# Patient Record
Sex: Male | Born: 1960
Health system: Southern US, Community
[De-identification: ages and names within clinical notes are randomized; demographics above are authoritative.]

## PROBLEM LIST (undated history)

## (undated) DIAGNOSIS — M1990 Unspecified osteoarthritis, unspecified site: Secondary | ICD-10-CM

## (undated) DIAGNOSIS — E785 Hyperlipidemia, unspecified: Secondary | ICD-10-CM

## (undated) DIAGNOSIS — K635 Polyp of colon: Secondary | ICD-10-CM

## (undated) DIAGNOSIS — T7840XA Allergy, unspecified, initial encounter: Secondary | ICD-10-CM

## (undated) HISTORY — DX: Polyp of colon: K63.5

## (undated) HISTORY — DX: Allergy, unspecified, initial encounter: T78.40XA

## (undated) HISTORY — DX: Hyperlipidemia, unspecified: E78.5

## (undated) HISTORY — PX: ROTATOR CUFF REPAIR: SHX139

## (undated) HISTORY — PX: CYSTECTOMY: SUR359

## (undated) HISTORY — PX: WISDOM TOOTH EXTRACTION: SHX21

## (undated) HISTORY — DX: Unspecified osteoarthritis, unspecified site: M19.90

## (undated) HISTORY — PX: COLONOSCOPY: SHX174

---

## 1981-05-21 HISTORY — PX: HAND SURGERY: SHX662

## 1999-03-28 ENCOUNTER — Encounter: Payer: Self-pay | Admitting: Emergency Medicine

## 1999-03-28 ENCOUNTER — Emergency Department (HOSPITAL_COMMUNITY): Admission: EM | Admit: 1999-03-28 | Discharge: 1999-03-28 | Payer: Self-pay | Admitting: Emergency Medicine

## 1999-05-22 DIAGNOSIS — K635 Polyp of colon: Secondary | ICD-10-CM

## 1999-05-22 HISTORY — DX: Polyp of colon: K63.5

## 1999-05-22 HISTORY — PX: COLONOSCOPY W/ POLYPECTOMY: SHX1380

## 1999-08-02 ENCOUNTER — Other Ambulatory Visit: Admission: RE | Admit: 1999-08-02 | Discharge: 1999-08-02 | Payer: Self-pay | Admitting: Gastroenterology

## 2002-11-22 ENCOUNTER — Encounter: Payer: Self-pay | Admitting: Emergency Medicine

## 2002-11-22 ENCOUNTER — Emergency Department (HOSPITAL_COMMUNITY): Admission: EM | Admit: 2002-11-22 | Discharge: 2002-11-23 | Payer: Self-pay | Admitting: *Deleted

## 2004-02-22 ENCOUNTER — Encounter: Payer: Self-pay | Admitting: Internal Medicine

## 2004-07-19 ENCOUNTER — Ambulatory Visit: Payer: Self-pay | Admitting: Gastroenterology

## 2004-08-04 ENCOUNTER — Ambulatory Visit: Payer: Self-pay | Admitting: Gastroenterology

## 2005-03-22 ENCOUNTER — Emergency Department (HOSPITAL_COMMUNITY): Admission: EM | Admit: 2005-03-22 | Discharge: 2005-03-22 | Payer: Self-pay | Admitting: *Deleted

## 2005-03-22 ENCOUNTER — Ambulatory Visit: Payer: Self-pay | Admitting: Internal Medicine

## 2005-03-24 ENCOUNTER — Emergency Department (HOSPITAL_COMMUNITY): Admission: EM | Admit: 2005-03-24 | Discharge: 2005-03-24 | Payer: Self-pay | Admitting: Family Medicine

## 2005-05-08 ENCOUNTER — Ambulatory Visit: Payer: Self-pay | Admitting: Internal Medicine

## 2005-05-15 ENCOUNTER — Ambulatory Visit: Payer: Self-pay | Admitting: Internal Medicine

## 2005-07-04 ENCOUNTER — Ambulatory Visit: Payer: Self-pay | Admitting: Internal Medicine

## 2005-07-19 ENCOUNTER — Ambulatory Visit: Payer: Self-pay | Admitting: Internal Medicine

## 2006-06-27 ENCOUNTER — Ambulatory Visit: Payer: Self-pay | Admitting: Internal Medicine

## 2006-06-27 LAB — CONVERTED CEMR LAB
ALT: 22 units/L (ref 0–40)
AST: 22 units/L (ref 0–37)
Albumin: 3.7 g/dL (ref 3.5–5.2)
Alkaline Phosphatase: 42 units/L (ref 39–117)
BUN: 9 mg/dL (ref 6–23)
Basophils Absolute: 0 10*3/uL (ref 0.0–0.1)
Basophils Relative: 0.6 % (ref 0.0–1.0)
Bilirubin, Direct: 0.1 mg/dL (ref 0.0–0.3)
CO2: 29 meq/L (ref 19–32)
Calcium: 9.4 mg/dL (ref 8.4–10.5)
Chloride: 108 meq/L (ref 96–112)
Cholesterol: 225 mg/dL (ref 0–200)
Creatinine, Ser: 1 mg/dL (ref 0.4–1.5)
Direct LDL: 170.5 mg/dL
Eosinophils Absolute: 0.1 10*3/uL (ref 0.0–0.6)
Eosinophils Relative: 2.7 % (ref 0.0–5.0)
GFR calc Af Amer: 103 mL/min
GFR calc non Af Amer: 85 mL/min
Glucose, Bld: 96 mg/dL (ref 70–99)
HCT: 47.3 % (ref 39.0–52.0)
HDL: 42.6 mg/dL (ref >39.0)
Hemoglobin: 15.9 g/dL (ref 13.0–17.0)
Lymphocytes Relative: 53.4 % — ABNORMAL HIGH (ref 12.0–46.0)
MCHC: 33.7 g/dL (ref 30.0–36.0)
MCV: 89.7 fL (ref 78.0–100.0)
Monocytes Absolute: 0.3 10*3/uL (ref 0.2–0.7)
Monocytes Relative: 9.2 % (ref 3.0–11.0)
Neutro Abs: 1.1 10*3/uL — ABNORMAL LOW (ref 1.4–7.7)
Neutrophils Relative %: 34.1 % — ABNORMAL LOW (ref 43.0–77.0)
PSA: 1.15 ng/mL (ref 0.10–4.00)
Platelets: 174 10*3/uL (ref 150–400)
Potassium: 4.3 meq/L (ref 3.5–5.1)
RBC: 5.27 M/uL (ref 4.22–5.81)
RDW: 13 % (ref 11.5–14.6)
Sodium: 141 meq/L (ref 135–145)
TSH: 1.22 microintl units/mL (ref 0.35–5.50)
Total Bilirubin: 0.6 mg/dL (ref 0.3–1.2)
Total CHOL/HDL Ratio: 5.3
Total Protein: 7.3 g/dL (ref 6.0–8.3)
Triglycerides: 89 mg/dL (ref 0–149)
VLDL: 18 mg/dL (ref 0–40)
WBC: 3.2 10*3/uL — ABNORMAL LOW (ref 4.5–10.5)

## 2006-09-04 DIAGNOSIS — M6740 Ganglion, unspecified site: Secondary | ICD-10-CM | POA: Insufficient documentation

## 2006-09-16 ENCOUNTER — Ambulatory Visit: Payer: Self-pay | Admitting: Internal Medicine

## 2006-09-16 LAB — CONVERTED CEMR LAB
ALT: 27 units/L (ref 0–40)
AST: 26 units/L (ref 0–37)
Cholesterol: 167 mg/dL (ref 0–200)
HDL: 53.3 mg/dL (ref >39.0)
LDL Cholesterol: 99 mg/dL (ref 0–99)
Total CHOL/HDL Ratio: 3.1
Triglycerides: 76 mg/dL (ref 0–149)
VLDL: 15 mg/dL (ref 0–40)

## 2007-11-18 ENCOUNTER — Ambulatory Visit: Payer: Self-pay | Admitting: Internal Medicine

## 2007-11-18 DIAGNOSIS — E785 Hyperlipidemia, unspecified: Secondary | ICD-10-CM

## 2007-11-18 DIAGNOSIS — E782 Mixed hyperlipidemia: Secondary | ICD-10-CM | POA: Insufficient documentation

## 2007-11-18 DIAGNOSIS — Z720 Tobacco use: Secondary | ICD-10-CM | POA: Insufficient documentation

## 2007-11-18 LAB — CONVERTED CEMR LAB
Cholesterol, target level: 200 mg/dL
HDL goal, serum: 40 mg/dL
LDL Goal: 130 mg/dL

## 2007-11-26 ENCOUNTER — Ambulatory Visit: Payer: Self-pay | Admitting: Internal Medicine

## 2007-11-27 ENCOUNTER — Encounter (INDEPENDENT_AMBULATORY_CARE_PROVIDER_SITE_OTHER): Payer: Self-pay | Admitting: *Deleted

## 2007-11-27 ENCOUNTER — Telehealth (INDEPENDENT_AMBULATORY_CARE_PROVIDER_SITE_OTHER): Payer: Self-pay | Admitting: *Deleted

## 2007-11-27 LAB — CONVERTED CEMR LAB
ALT: 21 units/L (ref 0–53)
AST: 20 units/L (ref 0–37)
Albumin: 3.8 g/dL (ref 3.5–5.2)
Alkaline Phosphatase: 41 units/L (ref 39–117)
BUN: 10 mg/dL (ref 6–23)
Basophils Absolute: 0.1 10*3/uL (ref 0.0–0.1)
Basophils Relative: 1.6 % — ABNORMAL HIGH (ref 0.0–1.0)
Bilirubin, Direct: 0.1 mg/dL (ref 0.0–0.3)
CO2: 28 meq/L (ref 19–32)
Calcium: 9.2 mg/dL (ref 8.4–10.5)
Chloride: 106 meq/L (ref 96–112)
Cholesterol: 235 mg/dL (ref 0–200)
Creatinine, Ser: 1 mg/dL (ref 0.4–1.5)
Direct LDL: 164.5 mg/dL
Eosinophils Absolute: 0.1 10*3/uL (ref 0.0–0.7)
Eosinophils Relative: 3.3 % (ref 0.0–5.0)
GFR calc Af Amer: 102 mL/min
GFR calc non Af Amer: 84 mL/min
Glucose, Bld: 90 mg/dL (ref 70–99)
HCT: 46.8 % (ref 39.0–52.0)
HDL: 53.6 mg/dL (ref >39.0)
Hemoglobin: 15.8 g/dL (ref 13.0–17.0)
Lymphocytes Relative: 42.7 % (ref 12.0–46.0)
MCHC: 33.7 g/dL (ref 30.0–36.0)
MCV: 92.1 fL (ref 78.0–100.0)
Monocytes Absolute: 0.4 10*3/uL (ref 0.1–1.0)
Monocytes Relative: 11.5 % (ref 3.0–12.0)
Neutro Abs: 1.6 10*3/uL (ref 1.4–7.7)
Neutrophils Relative %: 40.9 % — ABNORMAL LOW (ref 43.0–77.0)
PSA: 1.02 ng/mL (ref 0.10–4.00)
Platelets: 166 10*3/uL (ref 150–400)
Potassium: 4 meq/L (ref 3.5–5.1)
RBC: 5.09 M/uL (ref 4.22–5.81)
RDW: 13 % (ref 11.5–14.6)
Sodium: 140 meq/L (ref 135–145)
TSH: 1.6 microintl units/mL (ref 0.35–5.50)
Total Bilirubin: 0.8 mg/dL (ref 0.3–1.2)
Total CHOL/HDL Ratio: 4.4
Total Protein: 7.1 g/dL (ref 6.0–8.3)
Triglycerides: 73 mg/dL (ref 0–149)
Uric Acid, Serum: 4.5 mg/dL (ref 4.0–7.8)
VLDL: 15 mg/dL (ref 0–40)
WBC: 3.8 10*3/uL — ABNORMAL LOW (ref 4.5–10.5)

## 2007-12-12 ENCOUNTER — Telehealth (INDEPENDENT_AMBULATORY_CARE_PROVIDER_SITE_OTHER): Payer: Self-pay | Admitting: *Deleted

## 2007-12-17 ENCOUNTER — Encounter: Admission: RE | Admit: 2007-12-17 | Discharge: 2008-01-22 | Payer: Self-pay | Admitting: Internal Medicine

## 2007-12-21 ENCOUNTER — Encounter: Payer: Self-pay | Admitting: Internal Medicine

## 2008-01-22 ENCOUNTER — Encounter: Payer: Self-pay | Admitting: Internal Medicine

## 2008-02-03 ENCOUNTER — Encounter: Payer: Self-pay | Admitting: Internal Medicine

## 2008-02-23 ENCOUNTER — Ambulatory Visit: Payer: Self-pay | Admitting: Internal Medicine

## 2008-02-29 LAB — CONVERTED CEMR LAB
ALT: 22 units/L (ref 0–53)
AST: 20 units/L (ref 0–37)
Albumin: 3.8 g/dL (ref 3.5–5.2)
Alkaline Phosphatase: 38 units/L — ABNORMAL LOW (ref 39–117)
Bilirubin, Direct: 0.1 mg/dL (ref 0.0–0.3)
Cholesterol: 212 mg/dL (ref 0–200)
Direct LDL: 133.8 mg/dL
HDL: 52.7 mg/dL (ref >39.0)
Total Bilirubin: 0.7 mg/dL (ref 0.3–1.2)
Total CHOL/HDL Ratio: 4
Total Protein: 7.1 g/dL (ref 6.0–8.3)
Triglycerides: 48 mg/dL (ref 0–149)
VLDL: 10 mg/dL (ref 0–40)

## 2008-03-04 ENCOUNTER — Telehealth (INDEPENDENT_AMBULATORY_CARE_PROVIDER_SITE_OTHER): Payer: Self-pay | Admitting: *Deleted

## 2008-03-04 ENCOUNTER — Encounter (INDEPENDENT_AMBULATORY_CARE_PROVIDER_SITE_OTHER): Payer: Self-pay | Admitting: *Deleted

## 2008-06-01 ENCOUNTER — Ambulatory Visit: Payer: Self-pay | Admitting: Internal Medicine

## 2008-06-01 ENCOUNTER — Telehealth (INDEPENDENT_AMBULATORY_CARE_PROVIDER_SITE_OTHER): Payer: Self-pay | Admitting: *Deleted

## 2008-06-09 ENCOUNTER — Telehealth (INDEPENDENT_AMBULATORY_CARE_PROVIDER_SITE_OTHER): Payer: Self-pay | Admitting: *Deleted

## 2008-06-15 ENCOUNTER — Encounter (INDEPENDENT_AMBULATORY_CARE_PROVIDER_SITE_OTHER): Payer: Self-pay | Admitting: *Deleted

## 2008-06-15 LAB — CONVERTED CEMR LAB
ALT: 26 units/L (ref 0–53)
AST: 19 units/L (ref 0–37)
Cholesterol: 194 mg/dL (ref 0–200)
HDL: 52.4 mg/dL (ref >39.0)
LDL Cholesterol: 126 mg/dL — ABNORMAL HIGH (ref 0–99)
Total CHOL/HDL Ratio: 3.7
Triglycerides: 79 mg/dL (ref 0–149)
VLDL: 16 mg/dL (ref 0–40)

## 2008-07-13 ENCOUNTER — Ambulatory Visit: Payer: Self-pay | Admitting: Internal Medicine

## 2008-07-13 DIAGNOSIS — F411 Generalized anxiety disorder: Secondary | ICD-10-CM | POA: Insufficient documentation

## 2008-07-13 DIAGNOSIS — F329 Major depressive disorder, single episode, unspecified: Secondary | ICD-10-CM | POA: Insufficient documentation

## 2008-08-12 ENCOUNTER — Ambulatory Visit: Payer: Self-pay | Admitting: Internal Medicine

## 2008-10-12 ENCOUNTER — Ambulatory Visit: Payer: Self-pay | Admitting: Internal Medicine

## 2009-01-21 ENCOUNTER — Ambulatory Visit: Payer: Self-pay | Admitting: Internal Medicine

## 2009-01-21 DIAGNOSIS — M79609 Pain in unspecified limb: Secondary | ICD-10-CM | POA: Insufficient documentation

## 2009-01-30 LAB — CONVERTED CEMR LAB
ALT: 20 units/L (ref 0–53)
AST: 18 units/L (ref 0–37)
Albumin: 4.1 g/dL (ref 3.5–5.2)
Alkaline Phosphatase: 42 units/L (ref 39–117)
BUN: 11 mg/dL (ref 6–23)
Basophils Absolute: 0 10*3/uL (ref 0.0–0.1)
Basophils Relative: 0.6 % (ref 0.0–3.0)
Bilirubin, Direct: 0 mg/dL (ref 0.0–0.3)
CO2: 31 meq/L (ref 19–32)
Calcium: 9.3 mg/dL (ref 8.4–10.5)
Chloride: 107 meq/L (ref 96–112)
Cholesterol: 236 mg/dL — ABNORMAL HIGH (ref 0–200)
Creatinine, Ser: 1 mg/dL (ref 0.4–1.5)
Direct LDL: 164.7 mg/dL
Eosinophils Absolute: 0.2 10*3/uL (ref 0.0–0.7)
Eosinophils Relative: 3.8 % (ref 0.0–5.0)
GFR calc non Af Amer: 101.59 mL/min (ref >60)
Glucose, Bld: 73 mg/dL (ref 70–99)
HCT: 44.8 % (ref 39.0–52.0)
HDL: 52.8 mg/dL (ref >39.00)
Hemoglobin: 15 g/dL (ref 13.0–17.0)
Lymphocytes Relative: 43.6 % (ref 12.0–46.0)
Lymphs Abs: 1.9 10*3/uL (ref 0.7–4.0)
MCHC: 33.4 g/dL (ref 30.0–36.0)
MCV: 92.1 fL (ref 78.0–100.0)
Monocytes Absolute: 0.5 10*3/uL (ref 0.1–1.0)
Monocytes Relative: 11.9 % (ref 3.0–12.0)
Neutro Abs: 1.7 10*3/uL (ref 1.4–7.7)
Neutrophils Relative %: 40.1 % — ABNORMAL LOW (ref 43.0–77.0)
PSA: 2.66 ng/mL (ref 0.10–4.00)
Platelets: 158 10*3/uL (ref 150.0–400.0)
Potassium: 4 meq/L (ref 3.5–5.1)
RBC: 4.87 M/uL (ref 4.22–5.81)
RDW: 14.2 % (ref 11.5–14.6)
Sodium: 143 meq/L (ref 135–145)
TSH: 1.11 microintl units/mL (ref 0.35–5.50)
Total Bilirubin: 0.9 mg/dL (ref 0.3–1.2)
Total CHOL/HDL Ratio: 4
Total Protein: 7.2 g/dL (ref 6.0–8.3)
Triglycerides: 79 mg/dL (ref 0.0–149.0)
Uric Acid, Serum: 4.3 mg/dL (ref 4.0–7.8)
VLDL: 15.8 mg/dL (ref 0.0–40.0)
WBC: 4.3 10*3/uL — ABNORMAL LOW (ref 4.5–10.5)

## 2009-02-02 ENCOUNTER — Telehealth (INDEPENDENT_AMBULATORY_CARE_PROVIDER_SITE_OTHER): Payer: Self-pay | Admitting: *Deleted

## 2009-02-02 ENCOUNTER — Encounter (INDEPENDENT_AMBULATORY_CARE_PROVIDER_SITE_OTHER): Payer: Self-pay | Admitting: *Deleted

## 2009-02-09 ENCOUNTER — Telehealth: Payer: Self-pay | Admitting: Internal Medicine

## 2009-04-26 ENCOUNTER — Ambulatory Visit: Payer: Self-pay | Admitting: Internal Medicine

## 2009-05-01 LAB — CONVERTED CEMR LAB
ALT: 27 units/L (ref 0–53)
AST: 23 units/L (ref 0–37)
Albumin: 3.8 g/dL (ref 3.5–5.2)
Alkaline Phosphatase: 32 units/L — ABNORMAL LOW (ref 39–117)
Bilirubin, Direct: 0 mg/dL (ref 0.0–0.3)
Cholesterol: 207 mg/dL — ABNORMAL HIGH (ref 0–200)
Direct LDL: 143 mg/dL
HDL: 55.3 mg/dL (ref >39.00)
Total Bilirubin: 0.7 mg/dL (ref 0.3–1.2)
Total CHOL/HDL Ratio: 4
Total Protein: 6.9 g/dL (ref 6.0–8.3)
Triglycerides: 52 mg/dL (ref 0.0–149.0)
VLDL: 10.4 mg/dL (ref 0.0–40.0)

## 2009-05-02 ENCOUNTER — Encounter (INDEPENDENT_AMBULATORY_CARE_PROVIDER_SITE_OTHER): Payer: Self-pay | Admitting: *Deleted

## 2009-06-30 ENCOUNTER — Encounter (INDEPENDENT_AMBULATORY_CARE_PROVIDER_SITE_OTHER): Payer: Self-pay | Admitting: *Deleted

## 2009-07-05 ENCOUNTER — Ambulatory Visit: Payer: Self-pay | Admitting: Internal Medicine

## 2009-07-11 LAB — CONVERTED CEMR LAB
ALT: 24 units/L (ref 0–53)
AST: 21 units/L (ref 0–37)
Albumin: 3.8 g/dL (ref 3.5–5.2)
Alkaline Phosphatase: 35 units/L — ABNORMAL LOW (ref 39–117)
Bilirubin, Direct: 0.1 mg/dL (ref 0.0–0.3)
Cholesterol: 206 mg/dL — ABNORMAL HIGH (ref 0–200)
Direct LDL: 132.3 mg/dL
HDL: 61.9 mg/dL (ref >39.00)
Total Bilirubin: 0.5 mg/dL (ref 0.3–1.2)
Total CHOL/HDL Ratio: 3
Total Protein: 7 g/dL (ref 6.0–8.3)
Triglycerides: 69 mg/dL (ref 0.0–149.0)
VLDL: 13.8 mg/dL (ref 0.0–40.0)

## 2009-08-11 ENCOUNTER — Encounter (INDEPENDENT_AMBULATORY_CARE_PROVIDER_SITE_OTHER): Payer: Self-pay | Admitting: *Deleted

## 2009-08-12 ENCOUNTER — Ambulatory Visit: Payer: Self-pay | Admitting: Gastroenterology

## 2009-08-25 ENCOUNTER — Ambulatory Visit: Payer: Self-pay | Admitting: Gastroenterology

## 2010-01-08 ENCOUNTER — Emergency Department (HOSPITAL_COMMUNITY): Admission: EM | Admit: 2010-01-08 | Discharge: 2010-01-09 | Payer: Self-pay | Admitting: Emergency Medicine

## 2010-05-16 ENCOUNTER — Ambulatory Visit: Payer: Self-pay | Admitting: Internal Medicine

## 2010-06-20 NOTE — Letter (Signed)
Summary: Colonoscopy Letter  [facility]  [address] [place], [place] [address]   Phone: [phone]  Fax: [phone]      June 30, 2009 MRN: [id number]   CARDER YIN [address] [place], [place]  [address]   Dear Mr. PERETTI,   According to your medical record, it is time for you to schedule a Colonoscopy. The American Cancer Society recommends this procedure as a method to detect early colon cancer. Patients with a family history of colon cancer, or a personal history of colon polyps or inflammatory bowel disease are at increased risk.  This letter has beeen generated based on the recommendations made at the time of your procedure. If you feel that in your particular situation this may no longer apply, please contact our office.  Please call our office at [phone] to schedule this appointment or to update your records at your earliest convenience.  Thank you for cooperating with [place] to provide you with the very best care possible.   Sincerely,  Judie Petit T. Russella Dar, M.D.  [facility] [phone]

## 2010-06-20 NOTE — Procedures (Signed)
Summary: Colonoscopy  Patient: Clifford Newman Note: All result statuses are Final unless otherwise noted.  Tests: (1) Colonoscopy (COL)   COL Colonoscopy           DONE     [facility]     [address]     [place], [place]  [address]           COLONOSCOPY PROCEDURE REPORT           PATIENT:  Clifford Newman, Clifford Newman  MR#:  [id number]     BIRTHDATE:  08/05/1960, 50 yrs. old  GENDER:  male           ENDOSCOPIST:  Judie Petit T. Russella Dar, MD, [facility]           PROCEDURE DATE:  08/25/2009     PROCEDURE:  Colonoscopy [address]2956     ASA CLASS:  Class II     INDICATIONS:  1) follow-up of polyp  2) family history of colon     cancer  3) screening, adenomatous polyp, 07/1999. Father at age 52.                 MEDICATIONS:   Fentanyl 100 mcg IV, Versed 7 mg IV           DESCRIPTION OF PROCEDURE:   After the risks benefits and     alternatives of the procedure were thoroughly explained, informed     consent was obtained.  Digital rectal exam was performed and     revealed no abnormalities.   The LB [id number]-[id number] endoscope     was introduced through the anus and advanced to the cecum, which     was identified by both the appendix and ileocecal valve, without     limitations.  The quality of the prep was excellent, using     MoviPrep.  The instrument was then slowly withdrawn as the colon     was fully examined.     <<PROCEDUREIMAGES>>           FINDINGS:  A normal appearing cecum, ileocecal valve, and     appendiceal orifice were identified. The ascending, hepatic     flexure, transverse, splenic flexure, descending, and rectum     appeared unremarkable. Mild diverticulosis was found in the     sigmoid colon. Retroflexed views in the rectum revealed no     abnormalities. The time to cecum =  2  minutes. The scope was then     withdrawn (time =  8.5  min) from the patient and the procedure     completed.           COMPLICATIONS:  None           ENDOSCOPIC IMPRESSION:           1) Mild  diverticulosis           RECOMMENDATIONS:     1) High fiber diet with liberal fluid intake.     2) Repeat Colonoscopy in 5 years.           Venita Lick. Russella Dar, MD, [facility]           CC: Pecola Lawless, MD           n.     Rosalie DoctorVenita Lick. Zamzam Whinery at 08/25/2009 09:00 AM           Alyce Pagan, [id number]  Note: An exclamation mark (!) indicates a result that was not dispersed into the  flowsheet. Document Creation Date: 08/25/2009 9:00 AM _______________________________________________________________________  (1) Order result status: Final Collection or observation date-time: 08/25/2009 08:57 Requested date-time:  Receipt date-time:  Reported date-time:  Referring Physician:   Ordering Physician: Claudette Head [phone]) Specimen Source:  Source: Launa Grill Order Number: [id number] Lab site:   Appended Document: Colonoscopy    Clinical Lists Changes  Observations: Added new observation of COLONNXTDUE: 08/2014 (08/25/2009 12:40)

## 2010-06-20 NOTE — Letter (Signed)
Summary: [facility] Instructions  [facility]  [address] [place], [place] [address]   Phone: [phone]  Fax: [phone]       JANTHONY HOLLEMAN    1960/10/12    MRN: [id number]        Procedure Day /Date:  08/25/09  Thursday       Arrival Time:  7:30am      Procedure Time:  8:30am     Location of Procedure:                    _ x_  [facility] (4th Floor)  PREPARATION FOR COLONOSCOPY WITH MOVIPREP   Starting 5 days prior to your procedure 08/20/09  do not eat nuts, seeds, popcorn, corn, beans, peas,  salads, or any raw vegetables.  Do not take any fiber supplements (e.g. Metamucil, Citrucel, and Benefiber).  THE DAY BEFORE YOUR PROCEDURE         DATE:   08/24/09  DAY:  Wednesday  1.  Drink clear liquids the entire day-NO SOLID FOOD  2.  Do not drink anything colored red or purple.  Avoid juices with pulp.  No orange juice.  3.  Drink at least 64 oz. (8 glasses) of fluid/clear liquids during the day to prevent dehydration and help the prep work efficiently.  CLEAR LIQUIDS INCLUDE: Water Jello Ice Popsicles Tea (sugar ok, no milk/cream) Powdered fruit flavored drinks Coffee (sugar ok, no milk/cream) Gatorade Juice: apple, white grape, white cranberry  Lemonade Clear bullion, consomm, broth Carbonated beverages (any kind) Strained chicken noodle soup Hard Candy                             4.  In the morning, mix first dose of MoviPrep solution:    Empty 1 Pouch A and 1 Pouch B into the disposable container    Add lukewarm drinking water to the top line of the container. Mix to dissolve    Refrigerate (mixed solution should be used within 24 hrs)  5.  Begin drinking the prep at 5:00 p.m. The MoviPrep container is divided by 4 marks.   Every 15 minutes drink the solution down to the next mark (approximately 8 oz) until the full liter is complete.   6.  Follow completed prep with 16 oz of clear liquid of your choice (Nothing red or purple).   Continue to drink clear liquids until bedtime.  7.  Before going to bed, mix second dose of MoviPrep solution:    Empty 1 Pouch A and 1 Pouch B into the disposable container    Add lukewarm drinking water to the top line of the container. Mix to dissolve    Refrigerate  THE DAY OF YOUR PROCEDURE      DATE:   08/25/09  DAY:  Thursday  Beginning at   3:30 a.m. (5 hours before procedure):         1. Every 15 minutes, drink the solution down to the next mark (approx 8 oz) until the full liter is complete.  2. Follow completed prep with 16 oz. of clear liquid of your choice.    3. You may drink clear liquids until 6:30am   (2 HOURS BEFORE PROCEDURE).   MEDICATION INSTRUCTIONS  Unless otherwise instructed, you should take regular prescription medications with a small sip of water   as early as possible the morning of your procedure.  OTHER INSTRUCTIONS  You will need a responsible adult at least 52 years of age to accompany you and drive you home.   This person must remain in the waiting room during your procedure.  Wear loose fitting clothing that is easily removed.  Leave jewelry and other valuables at home.  However, you may wish to bring a book to read or  an iPod/MP3 player to listen to music as you wait for your procedure to start.  Remove all body piercing jewelry and leave at home.  Total time from sign-in until discharge is approximately 2-3 hours.  You should go home directly after your procedure and rest.  You can resume normal activities the  day after your procedure.  The day of your procedure you should not:   Drive   Make legal decisions   Operate machinery   Drink alcohol   Return to work  You will receive specific instructions about eating, activities and medications before you leave.    The above instructions have been reviewed and explained to me by  Ezra Sites RN  August 12, 2009 8:54 AM    I fully understand and can verbalize  these instructions _____________________________ Date _________

## 2010-06-20 NOTE — Miscellaneous (Signed)
Summary: [facility]  Clinical Lists Changes  Medications: Added new medication of MOVIPREP 100 GM  SOLR (PEG-KCL-NACL-NASULF-NA ASC-C) As per prep instructions. - Signed Rx of MOVIPREP 100 GM  SOLR (PEG-KCL-NACL-NASULF-NA ASC-C) As per prep instructions.;  #1 x 0;  Signed;  Entered by: Ezra Sites RN;  Authorized by: Meryl Dare MD [facility];  Method used: Electronically to [facility]  [address] [id number] [address], [place], [place]  [address], Ph: [phone] or [phone], Fax: [phone] Observations: Added new observation of NKA: T (08/12/2009 8:37)    Prescriptions: MOVIPREP 100 GM  SOLR (PEG-KCL-NACL-NASULF-NA ASC-C) As per prep instructions.  #1 x 0   Entered by:   Ezra Sites RN   Authorized by:   Meryl Dare MD [facility]   Signed by:   Ezra Sites RN on 08/12/2009   Method used:   [facility]ronically to        [facility]  [address] [id number] (retail)       [address]       [place], [place]  [address]       Ph: [phone] or [phone]       Fax: [phone]   RxID:   [id number]

## 2010-08-04 LAB — CBC
HCT: 41.5 % (ref 39.0–52.0)
Hemoglobin: 14.4 g/dL (ref 13.0–17.0)
MCH: 31.1 pg (ref 26.0–34.0)
MCHC: 34.6 g/dL (ref 30.0–36.0)
MCV: 89.8 fL (ref 78.0–100.0)
Platelets: 152 K/uL (ref 150–400)
RBC: 4.63 MIL/uL (ref 4.22–5.81)
RDW: 13.6 % (ref 11.5–15.5)
WBC: 7.4 K/uL (ref 4.0–10.5)

## 2010-08-04 LAB — HEPATIC FUNCTION PANEL
ALT: 16 U/L (ref 0–53)
AST: 17 U/L (ref 0–37)
Albumin: 3.2 g/dL — ABNORMAL LOW (ref 3.5–5.2)
Alkaline Phosphatase: 40 U/L (ref 39–117)
Bilirubin, Direct: 0.2 mg/dL (ref 0.0–0.3)
Indirect Bilirubin: 0.8 mg/dL (ref 0.3–0.9)
Total Bilirubin: 1 mg/dL (ref 0.3–1.2)
Total Protein: 6.8 g/dL (ref 6.0–8.3)

## 2010-08-04 LAB — DIFFERENTIAL
Basophils Absolute: 0 10*3/uL (ref 0.0–0.1)
Basophils Relative: 0 % (ref 0–1)
Eosinophils Absolute: 0 10*3/uL (ref 0.0–0.7)
Eosinophils Relative: 1 % (ref 0–5)
Lymphocytes Relative: 14 % (ref 12–46)
Lymphs Abs: 1 10*3/uL (ref 0.7–4.0)
Monocytes Absolute: 0.8 10*3/uL (ref 0.1–1.0)
Monocytes Relative: 10 % (ref 3–12)
Neutro Abs: 5.6 10*3/uL (ref 1.7–7.7)
Neutrophils Relative %: 75 % (ref 43–77)

## 2010-08-04 LAB — URINE MICROSCOPIC-ADD ON

## 2010-08-04 LAB — URINALYSIS, ROUTINE W REFLEX MICROSCOPIC
Bilirubin Urine: NEGATIVE
Glucose, UA: NEGATIVE mg/dL
Hgb urine dipstick: NEGATIVE
Nitrite: NEGATIVE
Protein, ur: NEGATIVE mg/dL
Specific Gravity, Urine: 1.018 (ref 1.005–1.030)
Urobilinogen, UA: 0.2 mg/dL (ref 0.0–1.0)
pH: 7.5 (ref 5.0–8.0)

## 2010-08-04 LAB — POCT I-STAT, CHEM 8
BUN: 10 mg/dL (ref 6–23)
Calcium, Ion: 1.08 mmol/L — ABNORMAL LOW (ref 1.12–1.32)
Chloride: 107 mEq/L (ref 96–112)
Creatinine, Ser: 1 mg/dL (ref 0.4–1.5)
Glucose, Bld: 130 mg/dL — ABNORMAL HIGH (ref 70–99)
HCT: 46 % (ref 39.0–52.0)
Hemoglobin: 15.6 g/dL (ref 13.0–17.0)
Potassium: 3.8 mEq/L (ref 3.5–5.1)
Sodium: 140 mEq/L (ref 135–145)
TCO2: 25 mmol/L (ref 0–100)

## 2010-08-04 LAB — URINE CULTURE
Colony Count: >=100000
Culture  Setup Time: 201108220023

## 2010-08-04 LAB — LIPASE, BLOOD: Lipase: 22 U/L (ref 11–59)

## 2010-12-26 ENCOUNTER — Encounter: Payer: Self-pay | Admitting: Internal Medicine

## 2010-12-26 ENCOUNTER — Ambulatory Visit (INDEPENDENT_AMBULATORY_CARE_PROVIDER_SITE_OTHER): Payer: 59 | Admitting: Internal Medicine

## 2010-12-26 VITALS — BP 132/88 | HR 80 | Temp 98.6°F | Resp 14 | Ht 74.25 in | Wt 270.6 lb

## 2010-12-26 DIAGNOSIS — E785 Hyperlipidemia, unspecified: Secondary | ICD-10-CM

## 2010-12-26 DIAGNOSIS — F17200 Nicotine dependence, unspecified, uncomplicated: Secondary | ICD-10-CM

## 2010-12-26 DIAGNOSIS — Z0000 Encounter for general adult medical examination without abnormal findings: Secondary | ICD-10-CM

## 2010-12-26 LAB — CBC WITH DIFFERENTIAL/PLATELET
Basophils Absolute: 0 10*3/uL (ref 0.0–0.1)
Basophils Relative: 0.7 % (ref 0.0–3.0)
Eosinophils Absolute: 0.1 10*3/uL (ref 0.0–0.7)
Eosinophils Relative: 3.3 % (ref 0.0–5.0)
HCT: 46 % (ref 39.0–52.0)
Hemoglobin: 15.4 g/dL (ref 13.0–17.0)
Lymphocytes Relative: 39.6 % (ref 12.0–46.0)
Lymphs Abs: 1.5 10*3/uL (ref 0.7–4.0)
MCHC: 33.4 g/dL (ref 30.0–36.0)
MCV: 91 fl (ref 78.0–100.0)
Monocytes Absolute: 0.4 10*3/uL (ref 0.1–1.0)
Monocytes Relative: 10.6 % (ref 3.0–12.0)
Neutro Abs: 1.7 10*3/uL (ref 1.4–7.7)
Neutrophils Relative %: 45.8 % (ref 43.0–77.0)
Platelets: 163 10*3/uL (ref 150.0–400.0)
RBC: 5.06 Mil/uL (ref 4.22–5.81)
RDW: 14.8 % — ABNORMAL HIGH (ref 11.5–14.6)
WBC: 3.8 10*3/uL — ABNORMAL LOW (ref 4.5–10.5)

## 2010-12-26 LAB — BASIC METABOLIC PANEL
BUN: 12 mg/dL (ref 6–23)
CO2: 27 mEq/L (ref 19–32)
Calcium: 9 mg/dL (ref 8.4–10.5)
Chloride: 107 mEq/L (ref 96–112)
Creatinine, Ser: 1.1 mg/dL (ref 0.4–1.5)
GFR: 94.26 mL/min (ref >60.00)
Glucose, Bld: 91 mg/dL (ref 70–99)
Potassium: 4.3 mEq/L (ref 3.5–5.1)
Sodium: 142 mEq/L (ref 135–145)

## 2010-12-26 LAB — LIPID PANEL
Cholesterol: 226 mg/dL — ABNORMAL HIGH (ref 0–200)
HDL: 56.7 mg/dL (ref >39.00)
Total CHOL/HDL Ratio: 4
Triglycerides: 79 mg/dL (ref 0.0–149.0)
VLDL: 15.8 mg/dL (ref 0.0–40.0)

## 2010-12-26 LAB — HEPATIC FUNCTION PANEL
ALT: 23 U/L (ref 0–53)
AST: 19 U/L (ref 0–37)
Albumin: 3.8 g/dL (ref 3.5–5.2)
Alkaline Phosphatase: 40 U/L (ref 39–117)
Bilirubin, Direct: 0.1 mg/dL (ref 0.0–0.3)
Total Bilirubin: 0.5 mg/dL (ref 0.3–1.2)
Total Protein: 7 g/dL (ref 6.0–8.3)

## 2010-12-26 LAB — PSA: PSA: 1.17 ng/mL (ref 0.10–4.00)

## 2010-12-26 LAB — LDL CHOLESTEROL, DIRECT: Direct LDL: 143.4 mg/dL

## 2010-12-26 LAB — TSH: TSH: 1.44 u[IU]/mL (ref 0.35–5.50)

## 2010-12-26 MED ORDER — SILDENAFIL CITRATE 100 MG PO TABS: 100.0000 mg | ORAL_TABLET | Freq: Every day | ORAL | Status: DC | PRN

## 2010-12-26 MED ORDER — VARENICLINE TARTRATE 0.5 MG PO TABS: 0.5000 mg | ORAL_TABLET | Freq: Two times a day (BID) | ORAL | Status: AC

## 2010-12-26 NOTE — Patient Instructions (Signed)
Avoid stimulants :decongestants, diet pills, nicotine, caffeine( coffee, tea,cola, chocolate) to excess to prevent  palpitations Please think about quitting smoking. Review the risks we discussed. Please call 1-800-QUIT-NOW [phone]) for free smoking cessation counseling. Exercise at least 30-45 minutes a day,  3-4 days a week.  Eat a low-fat diet with lots of fruits and vegetables, up to 7-9 servings per day. Avoid obesity; your goal is waist measurement < 40 inches.Consume less than 40 grams of sugar per day from foods & drinks with High Fructose Corn Sugar as #1,2,3 or # 4 on label. Health Care Power of Attorney & Living Will. Complete if not in place ; these place you in charge of your health care decisions.

## 2010-12-26 NOTE — Progress Notes (Signed)
Subjective:    Patient ID: Clifford Newman, male    DOB: 12/14/1960, 50 y.o.   MRN: [id number]  HPI  Ron  is here for a physical; he has no acute issues.     Review of Systems Patient reports no  vision/ hearing changes,anorexia, weight change, fever ,adenopathy, persistant / recurrent hoarseness, swallowing issues, chest pain,palpitations, edema,persistant / recurrent cough, hemoptysis, dyspnea(rest, exertional, paroxysmal nocturnal), gastrointestinal  bleeding (melena, rectal bleeding), abdominal pain, excessive heart burn, GU symptoms( dysuria, hematuria, pyuria, voiding/incontinence  issues) syncope, focal weakness, memory loss,numbness & tingling, skin/hair/nail changes,depression,  abnormal bruising/bleeding, or  musculoskeletal symptoms/signs. For the last month he's been having only one bowel movement a day; in the past he's had 3-4. Stools are formed and not watery. The decrease bowel frequency has been associated with some bloating; has no other GI symptoms. Colonoscopy is up-to-date. He describes "good anxiety" over his impending retirement in less than a month. He had epididymitis in 12/2009; it resolved with antibiotics.     Objective:   Physical Exam Gen.: Healthy and well-nourished in appearance. Alert, appropriate and cooperative throughout exam. Head: Normocephalic without obvious abnormalities; head shaven.  Eyes: No corneal or conjunctival inflammation noted. Pupils equal round reactive to light and accommodation. Fundal exam is benign without hemorrhages, exudate, papilledema. Extraocular motion intact. Vision grossly normal. Ears: External  ear exam reveals no significant lesions or deformities. Canals clear .TMs normal. Hearing is grossly normal bilaterally. Nose: External nasal exam reveals no deformity or inflammation. Nasal mucosa are pink and moist. No lesions or exudates noted. Septum normal Mouth: Oral mucosa and oropharynx reveal no lesions or exudates. Teeth in good  repair. Neck: No deformities, masses, or tenderness noted. Range of motion &. Thyroid normal. Lungs: Normal respiratory effort; chest expands symmetrically. Lungs are clear to auscultation without rales, wheezes, or increased work of breathing. Heart: Normal rate and rhythm. Normal S1 and S2. No gallop, click, or rub. S4 w/o  murmur. Abdomen: Bowel sounds normal; abdomen soft and nontender. No masses, organomegaly or hernias noted. Genitalia/DRE: Varicocele on the left; the exam was uncomfortable for him and  the digital rectal exam was suboptimal.    .                                                                                   Musculoskeletal/extremities: No deformity or scoliosis noted of  the thoracic or lumbar spine. No clubbing, cyanosis, edema, or deformity noted. Range of motion  normal .Tone & strength  normal.Joints normal. Nail health  good. Vascular: Carotid, radial artery, dorsalis pedis and  posterior tibial pulses are full and equal. No bruits present. Neurologic: Alert and oriented x3. Deep tendon reflexes symmetrical and normal.          Skin: Intact without suspicious lesions or rashes. Lymph: No cervical, axillary, or inguinal lymphadenopathy present. Psych: Mood and affect are normal. Normally interactive  Assessment & Plan:  #1 comprehensive physical exam; no acute findings #2 see Problem List with Assessments & Recommendations Plan: see Orders

## 2011-01-02 ENCOUNTER — Telehealth: Payer: Self-pay

## 2011-01-02 MED ORDER — PRAVASTATIN SODIUM 20 MG PO TABS: 20.0000 mg | ORAL_TABLET | ORAL | Status: DC

## 2011-01-02 NOTE — Telephone Encounter (Signed)
Message copied by Edgardo Roys on Tue Jan 02, 2011 11:53 AM ------      Message from: Pecola Lawless      Created: Mon Jan 01, 2011  5:28 PM       Minimally reduced white blood count is not significant as the differential or types of cells are normal. RDW measures size of red cells ; it is not important unless anemia is present. Risk of premature heart attack or stroke increases as LDL or BAD cholesterol rises.Advanced cholesterol panels optimally determine risk base on particle composition ( NMR Lipoprofile ) revealed your LDL goal is < 120.Please consider pravastatin 20 mg at bedtime with repeat fasting labs in 10 weeks (lipids, hepatic panel, CK; 272.4, 995.20). This medicine would cost $10 for 90 pills at [facility] or  [facility], if insurance not used.

## 2011-01-02 NOTE — Telephone Encounter (Signed)
RX and labs mailed to patient  

## 2012-08-12 ENCOUNTER — Encounter: Payer: Self-pay | Admitting: Internal Medicine

## 2012-08-12 ENCOUNTER — Ambulatory Visit (INDEPENDENT_AMBULATORY_CARE_PROVIDER_SITE_OTHER): Payer: 59 | Admitting: Internal Medicine

## 2012-08-12 VITALS — BP 126/84 | HR 105 | Temp 98.5°F | Resp 14 | Ht 74.5 in | Wt 282.0 lb

## 2012-08-12 DIAGNOSIS — D126 Benign neoplasm of colon, unspecified: Secondary | ICD-10-CM | POA: Insufficient documentation

## 2012-08-12 DIAGNOSIS — Z0000 Encounter for general adult medical examination without abnormal findings: Secondary | ICD-10-CM

## 2012-08-12 LAB — CBC WITH DIFFERENTIAL/PLATELET
Basophils Absolute: 0 10*3/uL (ref 0.0–0.1)
Basophils Relative: 0.5 % (ref 0.0–3.0)
Eosinophils Absolute: 0.1 10*3/uL (ref 0.0–0.7)
Eosinophils Relative: 2.5 % (ref 0.0–5.0)
HCT: 47.7 % (ref 39.0–52.0)
Hemoglobin: 16.2 g/dL (ref 13.0–17.0)
Lymphocytes Relative: 40.7 % (ref 12.0–46.0)
Lymphs Abs: 1.9 10*3/uL (ref 0.7–4.0)
MCHC: 34 g/dL (ref 30.0–36.0)
MCV: 89.1 fl (ref 78.0–100.0)
Monocytes Absolute: 0.4 10*3/uL (ref 0.1–1.0)
Monocytes Relative: 9.6 % (ref 3.0–12.0)
Neutro Abs: 2.2 10*3/uL (ref 1.4–7.7)
Neutrophils Relative %: 46.7 % (ref 43.0–77.0)
Platelets: 185 10*3/uL (ref 150.0–400.0)
RBC: 5.35 Mil/uL (ref 4.22–5.81)
RDW: 13.7 % (ref 11.5–14.6)
WBC: 4.6 10*3/uL (ref 4.5–10.5)

## 2012-08-12 LAB — HEPATIC FUNCTION PANEL
ALT: 22 U/L (ref 0–53)
AST: 18 U/L (ref 0–37)
Albumin: 4.1 g/dL (ref 3.5–5.2)
Alkaline Phosphatase: 41 U/L (ref 39–117)
Bilirubin, Direct: 0.1 mg/dL (ref 0.0–0.3)
Total Bilirubin: 0.7 mg/dL (ref 0.3–1.2)
Total Protein: 7.8 g/dL (ref 6.0–8.3)

## 2012-08-12 LAB — LIPID PANEL
Cholesterol: 270 mg/dL — ABNORMAL HIGH (ref 0–200)
HDL: 50.8 mg/dL (ref >39.00)
Total CHOL/HDL Ratio: 5
Triglycerides: 91 mg/dL (ref 0.0–149.0)
VLDL: 18.2 mg/dL (ref 0.0–40.0)

## 2012-08-12 LAB — BASIC METABOLIC PANEL
BUN: 12 mg/dL (ref 6–23)
CO2: 25 mEq/L (ref 19–32)
Calcium: 9.5 mg/dL (ref 8.4–10.5)
Chloride: 105 mEq/L (ref 96–112)
Creatinine, Ser: 1.1 mg/dL (ref 0.4–1.5)
GFR: 94.7 mL/min (ref >60.00)
Glucose, Bld: 109 mg/dL — ABNORMAL HIGH (ref 70–99)
Potassium: 3.9 mEq/L (ref 3.5–5.1)
Sodium: 137 mEq/L (ref 135–145)

## 2012-08-12 LAB — LDL CHOLESTEROL, DIRECT: Direct LDL: 203.3 mg/dL

## 2012-08-12 LAB — TSH: TSH: 1.74 u[IU]/mL (ref 0.35–5.50)

## 2012-08-12 MED ORDER — VARENICLINE TARTRATE 0.5 MG PO TABS: 0.5000 mg | ORAL_TABLET | Freq: Two times a day (BID) | ORAL | Status: DC

## 2012-08-12 MED ORDER — SILDENAFIL CITRATE 100 MG PO TABS: 100.0000 mg | ORAL_TABLET | Freq: Every day | ORAL | Status: DC | PRN

## 2012-08-12 NOTE — Patient Instructions (Addendum)
Preventive Health Care: Exercise at least 30-45 minutes a day,  3-4 days a week.  Eat a low-fat diet with lots of fruits and vegetables, up to 7-9 servings per day. This would eliminate the need for vitamin supplements. Consume less than 40 grams of sugar (preferably ZERO) per day from foods & drinks with High Fructose Corn Sugar as #1,2,3 or # 4 on label. Use an anti-inflammatory cream such as Aspercreme or Zostrix cream twice a day to the left shoulder as needed. In lieu of this warm moist compresses or  hot water bottle can be used. Do not apply ice .  I recommend a Physical Therapy consultation to determine optimal therapy if shoulder symptoms persist.  Order for x-rays entered into  the computer; these will be performed at [address] [facility] across from [facility]. No appointment is necessary.  Please think about quitting smoking. Review the risks we discussed. Please call 1-800-QUIT-NOW (586-316-5225) for free smoking cessation counseling.  If you activate the  My Chart system; lab & Xray results will be released directly  to you as soon as I review & address these through the computer. If you choose not to sign up for My Chart within 36 hours of labs being drawn; results will be reviewed & interpretation added before being copied & mailed, causing a delay in getting the results to you.If you do not receive that report within 7-10 days ,please call. Additionally you can use this system to gain direct  access to your records  if  out of town or @ an office of a  physician who is not in  the My Chart network.  This improves continuity of care & places you in control of your medical record.

## 2012-08-12 NOTE — Progress Notes (Signed)
Subjective:    Patient ID: Clifford Newman, male    DOB: 08-May-1961, 52 y.o.   MRN: [id number]  HPI  He is here for a physical;acute issues include L shoulder pain.     Review of Systems The pain began 12/2011 in L shoulder with associated  trigger of cutting tree limbs. Recurrence post moving tree limbs after ice strom 2 weeks ago. It is described as  Throbbing  up to level 6. The pain does not radiate. The discomfort can  last  Minutes. It is exacerbated by elevation.The LLDP position improves it. No associated signs/symptoms of redness ,swelling ,stiffness, skin color change, or  temperature change The pain was treated with NSAIDS  ; response was partial .   Negative or absent signs& symptoms are delineated below: Constitutional: no fever, chills, sweats, change in weight  Musculoskeletal:no  muscle cramps or pain Neuro: no weakness; incontinence (stool/urine); numbness and tingling Heme:no lymphadenopathy; abnormal bruising or bleeding                                                                                       Objective:   Physical Exam Gen.: Healthy and well-nourished in appearance. Alert, appropriate and cooperative throughout exam. Appears younger than stated age  Head: Normocephalic without obvious abnormalities;  Head shaven  Eyes: No corneal or conjunctival inflammation noted. Pupils equal round reactive to light and accommodation.  Extraocular motion intact. Vision grossly normal without lenses Ears: External  ear exam reveals no significant lesions or deformities. Canals clear .TMs normal. Hearing is grossly normal bilaterally. Nose: External nasal exam reveals no deformity or inflammation. Nasal mucosa are pink and moist. No lesions or exudates noted.  Mouth: Oral mucosa and oropharynx reveal no lesions or exudates. Teeth in good repair.Hoarse Neck: No deformities, masses, or tenderness noted. Range of motion & Thyroid normal. Lungs: Normal respiratory  effort; chest expands symmetrically. Mild rhonchi RLL w/o rales, wheezes, or increased work of breathing. Heart: Normal rate and rhythm. Normal S1 and S2. No gallop, click, or rub. S4 with slurring at  LSB. Abdomen: Bowel sounds normal; abdomen soft and nontender. No masses, organomegaly or hernias noted. Genitalia: Genitalia normal except for left varices. Prostate is limited by sphincter tone. Musculoskeletal/extremities: No deformity or scoliosis noted of  the thoracic or lumbar spine.  No clubbing, cyanosis, edema, or significant extremity  deformity noted. Range of motion normal .Tone & strength  Normal. Ganglion left wrist medially. Slight crepitus left greater than right shoulder with range of motion. No pain with superior elevation of the left upper extremity. Some pain to opposition on the left with arms elevated at 90 Joints normal. Nail health good. Able to lie down & sit up w/o help. Negative SLR bilaterally Vascular: Carotid, radial artery, dorsalis pedis and  posterior tibial pulses are full and equal. No bruits present. Neurologic: Alert and oriented x3. Deep tendon reflexes symmetrical and normal.     Skin: Intact without suspicious lesions or rashes. Lymph: No cervical, axillary, or inguinal lymphadenopathy present. Psych: Mood and affect are normal. Normally interactive  Assessment & Plan:  #1 comprehensive physical exam; no acute findings  Plan: see Orders  & Recommendations

## 2012-08-14 ENCOUNTER — Ambulatory Visit: Payer: 59

## 2012-08-14 ENCOUNTER — Telehealth: Payer: Self-pay

## 2012-08-14 DIAGNOSIS — R7309 Other abnormal glucose: Secondary | ICD-10-CM

## 2012-08-14 LAB — HEMOGLOBIN A1C: Hgb A1c MFr Bld: 6.1 % (ref 4.6–6.5)

## 2012-08-14 MED ORDER — ATORVASTATIN CALCIUM 20 MG PO TABS: 20.0000 mg | ORAL_TABLET | Freq: Every day | ORAL | Status: DC

## 2012-08-14 NOTE — Telephone Encounter (Signed)
New RX for Lipitor mailed with recent lab results

## 2012-08-14 NOTE — Telephone Encounter (Signed)
Message copied by Maurice Small on Thu Aug 14, 2012  8:32 AM ------      Message from: Pecola Lawless      Created: Thu Aug 14, 2012  8:30 AM       Please add A1c (790.29).      Please send new  Rx for  Atorvastatin 20 mg #90 ------

## 2012-08-20 ENCOUNTER — Ambulatory Visit (INDEPENDENT_AMBULATORY_CARE_PROVIDER_SITE_OTHER)
Admission: RE | Admit: 2012-08-20 | Discharge: 2012-08-20 | Disposition: A | Discharge status: Home / Self Care | Payer: 59 | Source: Ambulatory Visit | Attending: Internal Medicine | Admitting: Internal Medicine

## 2012-08-20 DIAGNOSIS — Z0000 Encounter for general adult medical examination without abnormal findings: Secondary | ICD-10-CM

## 2012-08-21 ENCOUNTER — Encounter: Payer: Self-pay | Admitting: *Deleted

## 2012-10-23 ENCOUNTER — Other Ambulatory Visit: Payer: 59

## 2012-10-31 ENCOUNTER — Other Ambulatory Visit (INDEPENDENT_AMBULATORY_CARE_PROVIDER_SITE_OTHER): Payer: 59

## 2012-10-31 ENCOUNTER — Telehealth: Payer: Self-pay | Admitting: Internal Medicine

## 2012-10-31 DIAGNOSIS — E785 Hyperlipidemia, unspecified: Secondary | ICD-10-CM

## 2012-10-31 DIAGNOSIS — T887XXA Unspecified adverse effect of drug or medicament, initial encounter: Secondary | ICD-10-CM

## 2012-10-31 LAB — LIPID PANEL
Cholesterol: 233 mg/dL — ABNORMAL HIGH (ref 0–200)
HDL: 49.8 mg/dL (ref >39.00)
Total CHOL/HDL Ratio: 5
Triglycerides: 93 mg/dL (ref 0.0–149.0)
VLDL: 18.6 mg/dL (ref 0.0–40.0)

## 2012-10-31 LAB — HEPATIC FUNCTION PANEL
ALT: 22 U/L (ref 0–53)
AST: 18 U/L (ref 0–37)
Albumin: 3.8 g/dL (ref 3.5–5.2)
Alkaline Phosphatase: 39 U/L (ref 39–117)
Bilirubin, Direct: 0 mg/dL (ref 0.0–0.3)
Total Bilirubin: 0.7 mg/dL (ref 0.3–1.2)
Total Protein: 7.1 g/dL (ref 6.0–8.3)

## 2012-10-31 LAB — CK: Total CK: 142 U/L (ref 7–232)

## 2012-10-31 LAB — LDL CHOLESTEROL, DIRECT: Direct LDL: 167 mg/dL

## 2012-10-31 NOTE — Telephone Encounter (Signed)
Discuss with patient. pt chart updated Pt last had colonoscopy done 08-25-12 per chart Pt not due until 2016

## 2012-10-31 NOTE — Telephone Encounter (Signed)
Left message to call office to advise Pt that any procedure done outside the system will not populate in our system unless he may his provider aware at [facility]. If Pt knows that he is not due for anything Mychart list he can disregard notices.

## 2012-10-31 NOTE — Telephone Encounter (Signed)
PT came in and stated that his my chart is currently stating that its time for his colonoscopy but he is not due for one yet. Please review chart. cm

## 2012-11-13 ENCOUNTER — Encounter: Payer: Self-pay | Admitting: Internal Medicine

## 2012-11-13 ENCOUNTER — Ambulatory Visit (INDEPENDENT_AMBULATORY_CARE_PROVIDER_SITE_OTHER): Payer: 59 | Admitting: Internal Medicine

## 2012-11-13 VITALS — BP 134/84 | HR 86 | Temp 98.4°F | Wt 280.0 lb

## 2012-11-13 DIAGNOSIS — E785 Hyperlipidemia, unspecified: Secondary | ICD-10-CM

## 2012-11-13 DIAGNOSIS — F17200 Nicotine dependence, unspecified, uncomplicated: Secondary | ICD-10-CM

## 2012-11-13 DIAGNOSIS — Z0000 Encounter for general adult medical examination without abnormal findings: Secondary | ICD-10-CM

## 2012-11-13 LAB — POCT URINALYSIS DIPSTICK
Bilirubin, UA: NEGATIVE
Blood, UA: NEGATIVE
Glucose, UA: NEGATIVE
Ketones, UA: NEGATIVE
Leukocytes, UA: NEGATIVE
Nitrite, UA: NEGATIVE
Spec Grav, UA: 1.01
Urobilinogen, UA: 0.2
pH, UA: 7.5

## 2012-11-13 NOTE — Patient Instructions (Addendum)
Please  schedule fasting Labs in 12 weeks : CK,Lipids, hepatic panel. PLEASE BRING THESE INSTRUCTIONS TO FOLLOW UP  LAB APPOINTMENT.This will guarantee correct labs are drawn, eliminating need for repeat blood sampling ( needle sticks ! ). Diagnoses /Codes: 272.4, 995.20

## 2012-11-13 NOTE — Progress Notes (Signed)
  Subjective:    Patient ID: Clifford Newman, male    DOB: 08-04-1960, 52 y.o.   MRN: [id number]  HPI  He is here to complete preemployment physical form.  He is now on atorvastatin 20 mg 1-1/2 pills daily; followup fasting labs will be completed in the near future.    Review of Systems He is on a heart healthy diet; he exercises 45-60 minutes 3 times per week  As walking & treadmill without symptoms. Specifically he denies chest pain, palpitations, dyspnea, or claudication. Family history is negative for premature coronary disease. Advanced cholesterol testing reveals his LDL goal was less than 120; it was 203.3 on 08/12/12. Followup LDL was 167 on 10/31/12.      Objective:   Physical Exam Appears healthy and well-nourished & in no acute distress  No carotid bruits are present.No neck pain distention present at 10 - 15 degrees. Thyroid normal to palpation  Heart rhythm and rate are normal with no significant murmurs or gallops. Accentuated   Chest is clear with no increased work of breathing S1 There is no evidence of aortic aneurysm or renal artery bruits  Abdomen soft with no organomegaly or masses. No HJR  No clubbing, cyanosis or edema present.  Pedal pulses are intact   No ischemic skin changes are present . Nails healthy   Alert and oriented. Strength, tone, DTRs reflexes normal        Assessment & Plan:  #1 extensive form completed # dyslipidemia #3 smoker Risks & options discussed

## 2012-11-13 NOTE — Addendum Note (Signed)
Addended by: Shirlee More I on: 11/13/2012 09:00 AM   Modules accepted: Orders

## 2013-03-26 ENCOUNTER — Other Ambulatory Visit: Payer: Self-pay

## 2013-09-20 ENCOUNTER — Encounter: Payer: Self-pay | Admitting: Internal Medicine

## 2013-09-20 DIAGNOSIS — M75101 Unspecified rotator cuff tear or rupture of right shoulder, not specified as traumatic: Secondary | ICD-10-CM | POA: Insufficient documentation

## 2013-09-22 ENCOUNTER — Ambulatory Visit (INDEPENDENT_AMBULATORY_CARE_PROVIDER_SITE_OTHER): Payer: 59 | Admitting: Internal Medicine

## 2013-09-22 ENCOUNTER — Ambulatory Visit (INDEPENDENT_AMBULATORY_CARE_PROVIDER_SITE_OTHER)
Admission: RE | Admit: 2013-09-22 | Discharge: 2013-09-22 | Disposition: A | Discharge status: Home / Self Care | Payer: 59 | Source: Ambulatory Visit | Attending: Internal Medicine | Admitting: Internal Medicine

## 2013-09-22 ENCOUNTER — Telehealth: Payer: Self-pay | Admitting: Internal Medicine

## 2013-09-22 ENCOUNTER — Encounter: Payer: Self-pay | Admitting: Internal Medicine

## 2013-09-22 VITALS — BP 138/86 | HR 83 | Temp 98.2°F | Resp 15 | Wt 282.8 lb

## 2013-09-22 DIAGNOSIS — E785 Hyperlipidemia, unspecified: Secondary | ICD-10-CM

## 2013-09-22 DIAGNOSIS — M75101 Unspecified rotator cuff tear or rupture of right shoulder, not specified as traumatic: Secondary | ICD-10-CM

## 2013-09-22 DIAGNOSIS — F17200 Nicotine dependence, unspecified, uncomplicated: Secondary | ICD-10-CM

## 2013-09-22 DIAGNOSIS — R059 Cough, unspecified: Secondary | ICD-10-CM | POA: Insufficient documentation

## 2013-09-22 DIAGNOSIS — R05 Cough: Secondary | ICD-10-CM

## 2013-09-22 DIAGNOSIS — Z9189 Other specified personal risk factors, not elsewhere classified: Secondary | ICD-10-CM

## 2013-09-22 DIAGNOSIS — S43429A Sprain of unspecified rotator cuff capsule, initial encounter: Secondary | ICD-10-CM

## 2013-09-22 DIAGNOSIS — Z87898 Personal history of other specified conditions: Secondary | ICD-10-CM

## 2013-09-22 MED ORDER — ATORVASTATIN CALCIUM 20 MG PO TABS: 20.0000 mg | ORAL_TABLET | Freq: Every day | ORAL | Status: DC

## 2013-09-22 NOTE — Patient Instructions (Signed)
Your next office appointment will be determined based upon review of your pending labs & x-rays. Those instructions will be transmitted to you through My Chart   Followup as needed for your acute issue. Please report any significant change in your symptoms.  Please think about quitting smoking. Review the risks we discussed. Please call 1-800-QUIT-NOW (1-800-784-8669) for free smoking cessation counseling.  

## 2013-09-22 NOTE — Progress Notes (Signed)
Subjective:    Patient ID: Clifford Newman, male    DOB: [birth year], 53 y.o.   MRN: [id number]  HPI   He is having constant pain in the right shoulder; the severity and extent of his injury is outlined in the problem list. Tentatively surgery is scheduled 10/02/13  Past medical history, family history, and social history were reviewed and updated  He stopped taking his statin in December 2014. He substituted fish oil thinking that this would address the dyslipidemia. His LDL goal would be less than 120, ideally less than 90 based on the advanced cholesterol testing. There is no family history of premature coronary disease. Both paternal grandparents did have stroke, not premature.  Wife reports  significant snoring w/o apnea     Review of Systems Constitutional: No fever, chills, significant weight change, fatigue, weakness or night sweats Cardiovascular: no chest pain, palpitations, racing, irregular rhythm, syncope, nausea, sweating, claudication, or edema  Respiratory: Some cough with colorless sputum.No hemoptysis,  dyspnea, paroxysmal nocturnal dyspnea, or pleuritic chest pain.   Gastrointestinal: No heartburn,dysphagia, nausea and vomiting,ominal pain, change in bowels, anorexia, diarrhea, significant constipation, rectal bleeding, melena,  stool incontinence or jaundice Genitourinary: No dysuria,hematuria, pyuria, frequency, urgency,  incontinence, nocturia, dark urine or flank pain Dermatologic: No rash, pruritus, urticaria, or change in color or temperature of skin Neurologic: No  limb weakness, tremor, gait disturbance,  numbness or tingling Endocrine: No change in hair/skin/ nails, excessive thirst, excessive hunger, excessive urination, or unexplained fatigue Hematologic/lymphatic: No bruising, lymphadenopathy,or  abnormal clotting Allergy/immunology: No itchy/ watery eyes, abnormal sneezing, rhinitis, urticaria ,or angioedema. Some extrinsic symptoms with allergen exposure    Objective:   Physical Exam Gen.: Healthy and well-nourished in appearance. Alert, appropriate and cooperative throughout exam. Appears younger than stated age  Head: Normocephalic without obvious abnormalities;  Head shaven Eyes: No corneal or conjunctival inflammation noted. Pupils equal round reactive to light and accommodation.  Nose: External nasal exam reveals no deformity or inflammation. Nasal mucosa are pink and moist. No lesions or exudates noted.   Mouth: Oral mucosa and oropharynx reveal no lesions or exudates.Some erythema. Teeth in good repair.Deep voice Neck: No deformities, masses, or tenderness noted. Range of motion & thyroid normal Lungs: Normal respiratory effort; chest expands symmetrically. Lungs are clear to auscultation without rales, wheezes, or increased work of breathing.Decreased BS Heart: Normal rate and rhythm. Normal S1 and S2. No gallop, click, or rub. No murmur. Abdomen: Bowel sounds normal; abdomen soft and nontender. No masses, organomegaly or hernias noted.                               Musculoskeletal/extremities: No deformity or scoliosis noted of  the thoracic or lumbar spine.  No clubbing, cyanosis, edema, or significant extremity  deformity noted. Range of motion superiorly decreased R shoulder.Tone & strength normal. Hand joints normal Fingernail health good. Able to lie down & sit up w/o help. Negative SLR bilaterally Vascular: Carotid, radial artery, dorsalis pedis and  posterior tibial pulses are full and equal. No bruits present. Neurologic: Alert and oriented x3. Deep tendon reflexes symmetrical but 0+ @ knees.  Gait normal  . Skin: Intact without suspicious lesions or rashes. Lymph: No cervical, axillary lymphadenopathy present. Psych: Mood and affect are normal. Normally interactive  Assessment & Plan:  See Current Assessment & Plan in Problem List  under specific Diagnosis

## 2013-09-22 NOTE — Assessment & Plan Note (Signed)
Will need STOP BANG pre op screen

## 2013-09-22 NOTE — Assessment & Plan Note (Signed)
Lipids, LFTs, TSH  

## 2013-09-22 NOTE — Assessment & Plan Note (Signed)
Risks discussed 

## 2013-09-22 NOTE — Progress Notes (Signed)
Pre visit review using our clinic review tool, if applicable. No additional management support is needed unless otherwise documented below in the visit note. 

## 2013-09-22 NOTE — Telephone Encounter (Signed)
Relevant patient education assigned to patient using Emmi. ° °

## 2013-09-22 NOTE — Assessment & Plan Note (Signed)
[facility] 

## 2013-09-22 NOTE — Assessment & Plan Note (Signed)
STOP BANG pre op

## 2013-09-25 ENCOUNTER — Telehealth: Payer: Self-pay

## 2013-09-25 NOTE — Telephone Encounter (Signed)
He is medically cleared but he does require the screening for sleep apnea (STOP BANG) as per Anesthesiology consultation.

## 2013-09-25 NOTE — Telephone Encounter (Addendum)
Phone call from [employer] with [facility] states patient had an appt with you on 09/22/13 but the note does not state he is cleared for the surgery he has scheduled on 10/02/13. Needs clearance note faxed to  [phone]

## 2013-09-25 NOTE — Telephone Encounter (Signed)
Information has been faxed  

## 2013-09-30 ENCOUNTER — Encounter: Payer: Self-pay | Admitting: Internal Medicine

## 2014-07-29 ENCOUNTER — Ambulatory Visit (INDEPENDENT_AMBULATORY_CARE_PROVIDER_SITE_OTHER): Payer: BC Managed Care – PPO | Admitting: Internal Medicine

## 2014-07-29 ENCOUNTER — Encounter: Payer: Self-pay | Admitting: Internal Medicine

## 2014-07-29 VITALS — BP 132/72 | HR 94 | Temp 98.4°F | Resp 16 | Ht 75.0 in | Wt 290.0 lb

## 2014-07-29 DIAGNOSIS — Z800 Family history of malignant neoplasm of digestive organs: Secondary | ICD-10-CM

## 2014-07-29 DIAGNOSIS — Z0000 Encounter for general adult medical examination without abnormal findings: Secondary | ICD-10-CM

## 2014-07-29 DIAGNOSIS — F17200 Nicotine dependence, unspecified, uncomplicated: Secondary | ICD-10-CM

## 2014-07-29 DIAGNOSIS — E785 Hyperlipidemia, unspecified: Secondary | ICD-10-CM

## 2014-07-29 DIAGNOSIS — D126 Benign neoplasm of colon, unspecified: Secondary | ICD-10-CM

## 2014-07-29 DIAGNOSIS — Z01818 Encounter for other preprocedural examination: Secondary | ICD-10-CM | POA: Insufficient documentation

## 2014-07-29 MED ORDER — VARENICLINE TARTRATE 1 MG PO TABS: 1.0000 mg | ORAL_TABLET | Freq: Two times a day (BID) | ORAL | Status: DC

## 2014-07-29 MED ORDER — VARENICLINE TARTRATE 0.5 MG X 11 & 1 MG X 42 PO MISC: ORAL | Status: DC

## 2014-07-29 NOTE — Progress Notes (Signed)
Pre visit review using our clinic review tool, if applicable. No additional management support is needed unless otherwise documented below in the visit note. 

## 2014-07-29 NOTE — Assessment & Plan Note (Signed)
1 PPD smoker, wants to quit. Chantix prescribed today and strongly encouraged to quit.

## 2014-07-29 NOTE — Assessment & Plan Note (Signed)
Colonoscopy due this year. Declines flu shot. Tetanus shot due in 2020. Declines need for HIV screening. Talked to him about smoking cessation and better dietary habits today.

## 2014-07-29 NOTE — Progress Notes (Signed)
   Subjective:    Patient ID: Clifford Newman, male    DOB: [birth year], 54 y.o.   MRN: [id number]  HPI The patient is a 55 YO man who is here for wellness. He does have PMH of ED, hyperlipidemia, rotator cuff tear, tobacco abuse. He is ready to stop smoking and wants to work on losing a little bit of weight. He exercises enough but has some problems with controlling his eating. Denies new complaints since last visit.   PMH, [facility], social history reviewed and updated at today's visit.   Review of Systems  Constitutional: Negative for fever, chills, activity change, appetite change, fatigue and unexpected weight change.  HENT: Negative.   Eyes: Negative.   Respiratory: Negative for cough, chest tightness, shortness of breath and wheezing.   Cardiovascular: Negative for chest pain, palpitations and leg swelling.  Gastrointestinal: Negative for nausea, abdominal pain, diarrhea, constipation and abdominal distention.  Genitourinary: Negative.   Musculoskeletal: Negative.   Skin: Negative.   Neurological: Negative for dizziness, weakness, light-headedness, numbness and headaches.  Psychiatric/Behavioral: Negative.       Objective:   Physical Exam  Constitutional: He is oriented to person, place, and time. He appears well-developed and well-nourished.  HENT:  Head: Normocephalic and atraumatic.  Eyes: EOM are normal.  Neck: Normal range of motion.  Cardiovascular: Normal rate and regular rhythm.   No murmur heard. Carotids without bruits  Pulmonary/Chest: Effort normal and breath sounds normal. No respiratory distress. He has no wheezes. He has no rales.  Abdominal: Soft. Bowel sounds are normal. He exhibits no distension. There is no tenderness. There is no rebound.  Neurological: He is alert and oriented to person, place, and time. Coordination normal.  Skin: Skin is warm and dry.  Psychiatric: He has a normal mood and affect.   Filed Vitals:   07/29/14 1329  BP: 132/72  Pulse: 94    Temp: 98.4 F (36.9 C)  TempSrc: Oral  Resp: 16  Height: 6\' 3"  (1.905 m)  Weight: 290 lb (131.543 kg)  SpO2: 98%      Assessment & Plan:

## 2014-07-29 NOTE — Patient Instructions (Signed)
We have sent in for the chantix starting month as well as the continuing months. You do not need to quit before starting chantix. You just need to quit within the first month. You can stay on chantix for up to 6 months to help you quit. Smoking increases your risk of lung cancer, emphysema (damage to the lung causing breathing problems for the rest of your life), stroke, heart attack.  We will check the blood work today and send you the results on mychart.   Probably the best thing you could do for your health is to stop smoking. The second thing good for the health would be to lose about 10-15 pounds. This will help you feel like you have more energy and are less likely to have problems with the cholesterol and heart in the future.   Smoking Cessation Quitting smoking is important to your health and has many advantages. However, it is not always easy to quit since nicotine is a very addictive drug. Oftentimes, people try 3 times or more before being able to quit. This document explains the best ways for you to prepare to quit smoking. Quitting takes hard work and a lot of effort, but you can do it. ADVANTAGES OF QUITTING SMOKING  You will live longer, feel better, and live better.  Your body will feel the impact of quitting smoking almost immediately.  Within 20 minutes, blood pressure decreases. Your pulse returns to its normal level.  After 8 hours, carbon monoxide levels in the blood return to normal. Your oxygen level increases.  After 24 hours, the chance of having a heart attack starts to decrease. Your breath, hair, and body stop smelling like smoke.  After 48 hours, damaged nerve endings begin to recover. Your sense of taste and smell improve.  After 72 hours, the body is virtually free of nicotine. Your bronchial tubes relax and breathing becomes easier.  After 2 to 12 weeks, lungs can hold more air. Exercise becomes easier and circulation improves.  The risk of having a heart  attack, stroke, cancer, or lung disease is greatly reduced.  After 1 year, the risk of coronary heart disease is cut in half.  After 5 years, the risk of stroke falls to the same as a nonsmoker.  After 10 years, the risk of lung cancer is cut in half and the risk of other cancers decreases significantly.  After 15 years, the risk of coronary heart disease drops, usually to the level of a nonsmoker.  If you are pregnant, quitting smoking will improve your chances of having a healthy baby.  The people you live with, especially any children, will be healthier.  You will have extra money to spend on things other than cigarettes. QUESTIONS TO THINK ABOUT BEFORE ATTEMPTING TO QUIT You may want to talk about your answers with your health care provider.  Why do you want to quit?  If you tried to quit in the past, what helped and what did not?  What will be the most difficult situations for you after you quit? How will you plan to handle them?  Who can help you through the tough times? Your family? Friends? A health care provider?  What pleasures do you get from smoking? What ways can you still get pleasure if you quit? Here are some questions to ask your health care provider:  How can you help me to be successful at quitting?  What medicine do you think would be best for me and how  should I take it?  What should I do if I need more help?  What is smoking withdrawal like? How can I get information on withdrawal? GET READY  Set a quit date.  Change your environment by getting rid of all cigarettes, ashtrays, matches, and lighters in your home, car, or work. Do not let people smoke in your home.  Review your past attempts to quit. Think about what worked and what did not. GET SUPPORT AND ENCOURAGEMENT You have a better chance of being successful if you have help. You can get support in many ways.  Tell your family, friends, and coworkers that you are going to quit and need their  support. Ask them not to smoke around you.  Get individual, group, or telephone counseling and support. Programs are available at [employer] and health centers. Call your local health department for information about programs in your area.  Spiritual beliefs and practices may help some smokers quit.  Download a "quit meter" on your computer to keep track of quit statistics, such as how long you have gone without smoking, cigarettes not smoked, and money saved.  Get a self-help book about quitting smoking and staying off tobacco. [place]agan yourself from urges to smoke. Talk to someone, go for a walk, or occupy your time with a task.  Change your normal routine. Take a different route to work. Drink tea instead of coffee. Eat breakfast in a different place.  Reduce your stress. Take a hot bath, exercise, or read a book.  Plan something enjoyable to do every day. Reward yourself for not smoking.  Explore interactive web-based programs that specialize in helping you quit. GET MEDICINE AND USE IT CORRECTLY Medicines can help you stop smoking and decrease the urge to smoke. Combining medicine with the above behavioral methods and support can greatly increase your chances of successfully quitting smoking.  Nicotine replacement therapy helps deliver nicotine to your body without the negative effects and risks of smoking. Nicotine replacement therapy includes nicotine gum, lozenges, inhalers, nasal sprays, and skin patches. Some may be available over-the-counter and others require a prescription.  Antidepressant medicine helps people abstain from smoking, but how this works is unknown. This medicine is available by prescription.  Nicotinic receptor partial agonist medicine simulates the effect of nicotine in your brain. This medicine is available by prescription. Ask your health care provider for advice about which medicines to use and how to use them based on  your health history. Your health care provider will tell you what side effects to look out for if you choose to be on a medicine or therapy. Carefully read the information on the package. Do not use any other product containing nicotine while using a nicotine replacement product.  RELAPSE OR DIFFICULT SITUATIONS Most relapses occur within the first 3 months after quitting. Do not be discouraged if you start smoking again. Remember, most people try several times before finally quitting. You may have symptoms of withdrawal because your body is used to nicotine. You may crave cigarettes, be irritable, feel very hungry, cough often, get headaches, or have difficulty concentrating. The withdrawal symptoms are only temporary. They are strongest when you first quit, but they will go away within 10-14 days. To reduce the chances of relapse, try to:  Avoid drinking alcohol. Drinking lowers your chances of successfully quitting.  Reduce the amount of caffeine you consume. Once you quit smoking, the amount of caffeine in your body increases and  can give you symptoms, such as a rapid heartbeat, sweating, and anxiety.  Avoid smokers because they can make you want to smoke.  Do not let weight gain distract you. Many smokers will gain weight when they quit, usually less than 10 pounds. Eat a healthy diet and stay active. You can always lose the weight gained after you quit.  Find ways to improve your mood other than smoking. FOR MORE INFORMATION  www.smokefree.gov  Document Released: 05/01/2001 Document Revised: 09/21/2013 Document Reviewed: 08/16/2011 [facility] Patient Information 2015 [place], [place]. This information is not intended to replace advice given to you by your health care provider. Make sure you discuss any questions you have with your health care provider.  Health Maintenance A healthy lifestyle and preventative care can promote health and wellness.  Maintain regular health, dental, and eye  exams.  Eat a healthy diet. Foods like vegetables, fruits, whole grains, low-fat dairy products, and lean protein foods contain the nutrients you need and are low in calories. Decrease your intake of foods high in solid fats, added sugars, and salt. Get information about a proper diet from your health care provider, if necessary.  Regular physical exercise is one of the most important things you can do for your health. Most adults should get at least 150 minutes of moderate-intensity exercise (any activity that increases your heart rate and causes you to sweat) each week. In addition, most adults need muscle-strengthening exercises on 2 or more days a week.   Maintain a healthy weight. The body mass index (BMI) is a screening tool to identify possible weight problems. It provides an estimate of body fat based on height and weight. Your health care provider can find your BMI and can help you achieve or maintain a healthy weight. For males 20 years and older:  A BMI below 18.5 is considered underweight.  A BMI of 18.5 to 24.9 is normal.  A BMI of 25 to 29.9 is considered overweight.  A BMI of 30 and above is considered obese.  Maintain normal blood lipids and cholesterol by exercising and minimizing your intake of saturated fat. Eat a balanced diet with plenty of fruits and vegetables. Blood tests for lipids and cholesterol should begin at age 78 and be repeated every 5 years. If your lipid or cholesterol levels are high, you are over age 81, or you are at high risk for heart disease, you may need your cholesterol levels checked more frequently.Ongoing high lipid and cholesterol levels should be treated with medicines if diet and exercise are not working.  If you smoke, find out from your health care provider how to quit. If you do not use tobacco, do not start.  Lung cancer screening is recommended for adults aged 37-80 years who are at high risk for developing lung cancer because of a history  of smoking. A yearly low-dose CT scan of the lungs is recommended for people who have at least a 30-pack-year history of smoking and are current smokers or have quit within the past 15 years. A pack year of smoking is smoking an average of 1 pack of cigarettes a day for 1 year (for example, a 30-pack-year history of smoking could mean smoking 1 pack a day for 30 years or 2 packs a day for 15 years). Yearly screening should continue until the smoker has stopped smoking for at least 15 years. Yearly screening should be stopped for people who develop a health problem that would prevent them from having lung cancer  treatment.  If you choose to drink alcohol, do not have more than 2 drinks per day. One drink is considered to be 12 oz (360 mL) of beer, 5 oz (150 mL) of wine, or 1.5 oz (45 mL) of liquor.  Avoid the use of street drugs. Do not share needles with anyone. Ask for help if you need support or instructions about stopping the use of drugs.  High blood pressure causes heart disease and increases the risk of stroke. Blood pressure should be checked at least every 1-2 years. Ongoing high blood pressure should be treated with medicines if weight loss and exercise are not effective.  If you are 87-59 years old, ask your health care provider if you should take aspirin to prevent heart disease.  Diabetes screening involves taking a blood sample to check your fasting blood sugar level. This should be done once every 3 years after age 23 if you are at a normal weight and without risk factors for diabetes. Testing should be considered at a younger age or be carried out more frequently if you are overweight and have at least 1 risk factor for diabetes.  Colorectal cancer can be detected and often prevented. Most routine colorectal cancer screening begins at the age of 27 and continues through age 58. However, your health care provider may recommend screening at an earlier age if you have risk factors for colon  cancer. On a yearly basis, your health care provider may provide home test kits to check for hidden blood in the stool. A small camera at the end of a tube may be used to directly examine the colon (sigmoidoscopy or colonoscopy) to detect the earliest forms of colorectal cancer. Talk to your health care provider about this at age 74 when routine screening begins. A direct exam of the colon should be repeated every 5-10 years through age 76, unless early forms of precancerous polyps or small growths are found.  People who are at an increased risk for hepatitis B should be screened for this virus. You are considered at high risk for hepatitis B if:  You were born in a country where hepatitis B occurs often. Talk with your health care provider about which countries are considered high risk.  Your parents were born in a high-risk country and you have not received a shot to protect against hepatitis B (hepatitis B vaccine).  You have HIV or AIDS.  You use needles to inject street drugs.  You live with, or have sex with, someone who has hepatitis B.  You are a man who has sex with other men (MSM).  You get hemodialysis treatment.  You take certain medicines for conditions like cancer, organ transplantation, and autoimmune conditions.  Hepatitis C blood testing is recommended for all people born from [birth year] through 1967 and any individual with known risk factors for hepatitis C.  Healthy men should no longer receive prostate-specific antigen (PSA) blood tests as part of routine cancer screening. Talk to your health care provider about prostate cancer screening.  Testicular cancer screening is not recommended for adolescents or adult males who have no symptoms. Screening includes self-exam, a health care provider exam, and other screening tests. Consult with your health care provider about any symptoms you have or any concerns you have about testicular cancer.  Practice safe sex. Use condoms and  avoid high-risk sexual practices to reduce the spread of sexually transmitted infections (STIs).  You should be screened for STIs, including gonorrhea and chlamydia if:  You are sexually active and are younger than 24 years.  You are older than 24 years, and your health care provider tells you that you are at risk for this type of infection.  Your sexual activity has changed since you were last screened, and you are at an increased risk for chlamydia or gonorrhea. Ask your health care provider if you are at risk.  If you are at risk of being infected with HIV, it is recommended that you take a prescription medicine daily to prevent HIV infection. This is called pre-exposure prophylaxis (PrEP). You are considered at risk if:  You are a man who has sex with other men (MSM).  You are a heterosexual man who is sexually active with multiple partners.  You take drugs by injection.  You are sexually active with a partner who has HIV.  Talk with your health care provider about whether you are at high risk of being infected with HIV. If you choose to begin PrEP, you should first be tested for HIV. You should then be tested every 3 months for as long as you are taking PrEP.  Use sunscreen. Apply sunscreen liberally and repeatedly throughout the day. You should seek shade when your shadow is shorter than you. Protect yourself by wearing long sleeves, pants, a wide-brimmed hat, and sunglasses year round whenever you are outdoors.  Tell your health care provider of new moles or changes in moles, especially if there is a change in shape or color. Also, tell your health care provider if a mole is larger than the size of a pencil eraser.  A one-time screening for abdominal aortic aneurysm (AAA) and surgical repair of large AAAs by ultrasound is recommended for men aged 35-75 years who are current or former smokers.  Stay current with your vaccines (immunizations). Document Released: 11/03/2007 Document  Revised: 05/12/2013 Document Reviewed: 10/02/2010 [facility] Patient Information 2015 [place], [place]. This information is not intended to replace advice given to you by your health care provider. Make sure you discuss any questions you have with your health care provider.

## 2014-07-29 NOTE — Assessment & Plan Note (Signed)
Referral for GI today as due for recall colonoscopy this year.

## 2014-07-29 NOTE — Assessment & Plan Note (Signed)
Check lipid panel, he is off statins at this time. Encouraged him to work on weight loss for his health.

## 2014-07-30 ENCOUNTER — Ambulatory Visit: Payer: Self-pay | Admitting: Internal Medicine

## 2014-08-04 ENCOUNTER — Encounter: Payer: Self-pay | Admitting: Gastroenterology

## 2014-08-12 ENCOUNTER — Other Ambulatory Visit (INDEPENDENT_AMBULATORY_CARE_PROVIDER_SITE_OTHER): Payer: BC Managed Care – PPO

## 2014-08-12 DIAGNOSIS — Z0000 Encounter for general adult medical examination without abnormal findings: Secondary | ICD-10-CM

## 2014-08-12 LAB — HEMOGLOBIN A1C: Hgb A1c MFr Bld: 6.3 % (ref 4.6–6.5)

## 2014-08-12 LAB — LIPID PANEL
Cholesterol: 222 mg/dL — ABNORMAL HIGH (ref 0–200)
HDL: 51.2 mg/dL (ref >39.00)
LDL Cholesterol: 153 mg/dL — ABNORMAL HIGH (ref 0–99)
NonHDL: 170.8
Total CHOL/HDL Ratio: 4
Triglycerides: 87 mg/dL (ref 0.0–149.0)
VLDL: 17.4 mg/dL (ref 0.0–40.0)

## 2014-08-12 LAB — COMPREHENSIVE METABOLIC PANEL
ALT: 16 U/L (ref 0–53)
AST: 16 U/L (ref 0–37)
Albumin: 3.9 g/dL (ref 3.5–5.2)
Alkaline Phosphatase: 48 U/L (ref 39–117)
BUN: 15 mg/dL (ref 6–23)
CO2: 24 mEq/L (ref 19–32)
Calcium: 9.3 mg/dL (ref 8.4–10.5)
Chloride: 109 mEq/L (ref 96–112)
Creatinine, Ser: 1.2 mg/dL (ref 0.40–1.50)
GFR: 80.58 mL/min (ref >60.00)
Glucose, Bld: 100 mg/dL — ABNORMAL HIGH (ref 70–99)
Potassium: 4.1 mEq/L (ref 3.5–5.1)
Sodium: 139 mEq/L (ref 135–145)
Total Bilirubin: 0.3 mg/dL (ref 0.2–1.2)
Total Protein: 7.3 g/dL (ref 6.0–8.3)

## 2014-08-19 ENCOUNTER — Encounter: Payer: Self-pay | Admitting: Gastroenterology

## 2014-09-14 ENCOUNTER — Encounter: Payer: Self-pay | Admitting: Gastroenterology

## 2014-10-06 ENCOUNTER — Ambulatory Visit (AMBULATORY_SURGERY_CENTER): Payer: Self-pay | Admitting: *Deleted

## 2014-10-06 VITALS — Ht 75.0 in | Wt 278.0 lb

## 2014-10-06 DIAGNOSIS — Z800 Family history of malignant neoplasm of digestive organs: Secondary | ICD-10-CM

## 2014-10-06 MED ORDER — NA SULFATE-K SULFATE-MG SULF 17.5-3.13-1.6 GM/177ML PO SOLN: 1.0000 | Freq: Once | ORAL | Status: DC

## 2014-10-06 NOTE — Progress Notes (Signed)
No egg or soy allergy. No anesthesia problems.  No home O2.  No diet meds.  

## 2014-10-21 ENCOUNTER — Ambulatory Visit (AMBULATORY_SURGERY_CENTER): Payer: BC Managed Care – PPO | Admitting: Gastroenterology

## 2014-10-21 ENCOUNTER — Encounter: Payer: Self-pay | Admitting: Gastroenterology

## 2014-10-21 VITALS — BP 125/81 | HR 72 | Temp 96.8°F | Resp 16 | Ht 75.0 in | Wt 278.0 lb

## 2014-10-21 DIAGNOSIS — D123 Benign neoplasm of transverse colon: Secondary | ICD-10-CM

## 2014-10-21 DIAGNOSIS — Z86010 Personal history of colonic polyps: Secondary | ICD-10-CM | POA: Diagnosis not present

## 2014-10-21 DIAGNOSIS — Z800 Family history of malignant neoplasm of digestive organs: Secondary | ICD-10-CM | POA: Diagnosis present

## 2014-10-21 DIAGNOSIS — K635 Polyp of colon: Secondary | ICD-10-CM

## 2014-10-21 MED ORDER — SODIUM CHLORIDE 0.9 % IV SOLN: 500.0000 mL | INTRAVENOUS | Status: DC

## 2014-10-21 NOTE — Progress Notes (Signed)
Called to room to assist during endoscopic procedure.  Patient ID and intended procedure confirmed with present staff. Received instructions for my participation in the procedure from the performing physician.  

## 2014-10-21 NOTE — Progress Notes (Signed)
Report to PACU, RN, vss, BBS= Clear.  

## 2014-10-21 NOTE — Op Note (Signed)
[address] [place], [address]   COLONOSCOPY PROCEDURE REPORT  PATIENT: Clifford Newman, Clifford Newman  MR#: [id number] BIRTHDATE: 16-Jul-1960 , 61  yrs. old GENDER: male ENDOSCOPIST: Ladene Artist, MD, [facility] PROCEDURE DATE:  10/21/2014 PROCEDURE:   Colonoscopy, surveillance and Colonoscopy with snare polypectomy First Screening Colonoscopy - Avg.  risk and is 50 yrs.  old or older - No.  Prior Negative Screening - Now for repeat screening. N/A  History of Adenoma - Now for follow-up colonoscopy & has been > or = to 3 yrs.  Yes hx of adenoma.  Has been 3 or more years since last colonoscopy.  Polyps removed today? Yes ASA CLASS:   Class II INDICATIONS:Surveillance due to prior colonic neoplasia, PH Colon Adenoma, and FH Colon or Rectal Adenocarcinoma. MEDICATIONS: Monitored anesthesia care and Propofol 260 mg IV DESCRIPTION OF PROCEDURE:   After the risks benefits and alternatives of the procedure were thoroughly explained, informed consent was obtained.  The digital rectal exam revealed no abnormalities of the rectum.   The LB [id number]  endoscope was introduced through the anus and advanced to the cecum, which was identified by both the appendix and ileocecal valve. No adverse events experienced.   The quality of the prep was excellent. (Suprep was used)  The instrument was then slowly withdrawn as the colon was fully examined. Estimated blood loss is zero unless otherwise noted in this procedure report.    COLON FINDINGS: A sessile polyp measuring 7 mm in size was found in the transverse colon.  A polypectomy was performed with a cold snare.  The resection was complete, the polyp tissue was completely retrieved and sent to histology. Mild sigmoid colon diverituclosis noted.  The examination was otherwise normal.  Retroflexed views revealed no abnormalities. The time to cecum = 1.8 Withdrawal time = 9.3   The scope was withdrawn and the procedure  completed. COMPLICATIONS: There were no immediate complications.  ENDOSCOPIC IMPRESSION: 1.   Sessile polyp in the transverse colon; polypectomy performed with a cold snare 2.   Mild sigmoid colon diverticulosis  RECOMMENDATIONS: 1.  Await pathology results 2.  High fiber diet with liberal fluid intake. 3.  Repeat Colonoscopy in 5 years.  eSigned:  Ladene Artist, MD, [facility] 10/21/2014 8:34 AM

## 2014-10-21 NOTE — Patient Instructions (Signed)
YOU HAD AN ENDOSCOPIC PROCEDURE TODAY AT THE [facility]:   Refer to the procedure report that was given to you for any specific questions about what was found during the examination.  If the procedure report does not answer your questions, please call your gastroenterologist to clarify.  If you requested that your care partner not be given the details of your procedure findings, then the procedure report has been included in a sealed envelope for you to review at your convenience later.  YOU SHOULD EXPECT: Some feelings of bloating in the abdomen. Passage of more gas than usual.  Walking can help get rid of the air that was put into your GI tract during the procedure and reduce the bloating. If you had a lower endoscopy (such as a colonoscopy or flexible sigmoidoscopy) you may notice spotting of blood in your stool or on the toilet paper. If you underwent a bowel prep for your procedure, you may not have a normal bowel movement for a few days.  Please Note:  You might notice some irritation and congestion in your nose or some drainage.  This is from the oxygen used during your procedure.  There is no need for concern and it should clear up in a day or so.  SYMPTOMS TO REPORT IMMEDIATELY:   Following lower endoscopy (colonoscopy or flexible sigmoidoscopy):  Excessive amounts of blood in the stool  Significant tenderness or worsening of abdominal pains  Swelling of the abdomen that is new, acute  Fever of 100F or higher   For urgent or emergent issues, a gastroenterologist can be reached at any hour by calling [phone].   DIET: Your first meal following the procedure should be a small meal and then it is ok to progress to your normal diet. Heavy or fried foods are harder to digest and may make you feel nauseous or bloated.  Likewise, meals heavy in dairy and vegetables can increase bloating.  Drink plenty of fluids but you should avoid alcoholic beverages for 24  hours.  ACTIVITY:  You should plan to take it easy for the rest of today and you should NOT DRIVE or use heavy machinery until tomorrow (because of the sedation medicines used during the test).    FOLLOW UP: Our staff will call the number listed on your records the next business day following your procedure to check on you and address any questions or concerns that you may have regarding the information given to you following your procedure. If we do not reach you, we will leave a message.  However, if you are feeling well and you are not experiencing any problems, there is no need to return our call.  We will assume that you have returned to your regular daily activities without incident.  If any biopsies were taken you will be contacted by phone or by letter within the next 1-3 weeks.  Please call us at [phone] if you have not heard about the biopsies in 3 weeks.    SIGNATURES/CONFIDENTIALITY: You and/or your care partner have signed paperwork which will be entered into your electronic medical record.  These signatures attest to the fact that that the information above on your After Visit Summary has been reviewed and is understood.  Full responsibility of the confidentiality of this discharge information lies with you and/or your care-partner. 

## 2014-10-22 ENCOUNTER — Telehealth: Payer: Self-pay | Admitting: *Deleted

## 2014-10-22 NOTE — Telephone Encounter (Signed)
No answer, left message to call if questions or concerns. 

## 2014-10-29 ENCOUNTER — Encounter: Payer: Self-pay | Admitting: Gastroenterology

## 2015-07-05 ENCOUNTER — Other Ambulatory Visit (INDEPENDENT_AMBULATORY_CARE_PROVIDER_SITE_OTHER): Payer: BC Managed Care – PPO

## 2015-07-05 ENCOUNTER — Ambulatory Visit (INDEPENDENT_AMBULATORY_CARE_PROVIDER_SITE_OTHER): Payer: BC Managed Care – PPO | Admitting: Internal Medicine

## 2015-07-05 ENCOUNTER — Encounter: Payer: Self-pay | Admitting: Internal Medicine

## 2015-07-05 VITALS — BP 116/60 | HR 80 | Temp 98.2°F | Resp 16 | Ht 75.0 in | Wt 272.0 lb

## 2015-07-05 DIAGNOSIS — E785 Hyperlipidemia, unspecified: Secondary | ICD-10-CM

## 2015-07-05 DIAGNOSIS — Z0000 Encounter for general adult medical examination without abnormal findings: Secondary | ICD-10-CM | POA: Diagnosis not present

## 2015-07-05 DIAGNOSIS — L600 Ingrowing nail: Secondary | ICD-10-CM

## 2015-07-05 LAB — COMPREHENSIVE METABOLIC PANEL
ALT: 19 U/L (ref 0–53)
AST: 17 U/L (ref 0–37)
Albumin: 4.1 g/dL (ref 3.5–5.2)
Alkaline Phosphatase: 39 U/L (ref 39–117)
BUN: 15 mg/dL (ref 6–23)
CO2: 27 mEq/L (ref 19–32)
Calcium: 9.4 mg/dL (ref 8.4–10.5)
Chloride: 106 mEq/L (ref 96–112)
Creatinine, Ser: 0.93 mg/dL (ref 0.40–1.50)
GFR: 107.79 mL/min (ref >60.00)
Glucose, Bld: 86 mg/dL (ref 70–99)
Potassium: 4.6 mEq/L (ref 3.5–5.1)
Sodium: 138 mEq/L (ref 135–145)
Total Bilirubin: 0.7 mg/dL (ref 0.2–1.2)
Total Protein: 7.1 g/dL (ref 6.0–8.3)

## 2015-07-05 LAB — LIPID PANEL
Cholesterol: 212 mg/dL — ABNORMAL HIGH (ref 0–200)
HDL: 47.8 mg/dL (ref >39.00)
LDL Cholesterol: 148 mg/dL — ABNORMAL HIGH (ref 0–99)
NonHDL: 164.2
Total CHOL/HDL Ratio: 4
Triglycerides: 82 mg/dL (ref 0.0–149.0)
VLDL: 16.4 mg/dL (ref 0.0–40.0)

## 2015-07-05 LAB — HEMOGLOBIN A1C: Hgb A1c MFr Bld: 6.1 % (ref 4.6–6.5)

## 2015-07-05 MED ORDER — SILDENAFIL CITRATE 100 MG PO TABS: 50.0000 mg | ORAL_TABLET | Freq: Every day | ORAL | Status: DC | PRN

## 2015-07-05 NOTE — Progress Notes (Signed)
Pre visit review using our clinic review tool, if applicable. No additional management support is needed unless otherwise documented below in the visit note. 

## 2015-07-05 NOTE — Assessment & Plan Note (Signed)
Colonoscopy done last year and due in 5 years, checking labs today. Encouraged him to continue with his healthy diet and exercise. Encouraged to fully quit smoking. Given screening recommendations.

## 2015-07-05 NOTE — Addendum Note (Signed)
Addended by: Pricilla Holm A on: 07/05/2015 03:39 PM   Modules accepted: Orders

## 2015-07-05 NOTE — Progress Notes (Signed)
   Subjective:    Patient ID: Clifford Newman, male    DOB: 1961/04/19, 55 y.o.   MRN: [id number]  HPI The patient is a 55 YO man coming in for wellness. No new concerns. He is working on his diet and exercising more and has lost 10 pounds since last visit.   PMH, [facility], social history reviewed and updated.   Review of Systems  Constitutional: Negative for fever, chills, activity change, appetite change, fatigue and unexpected weight change.  HENT: Negative.   Eyes: Negative.   Respiratory: Negative for cough, chest tightness, shortness of breath and wheezing.   Cardiovascular: Negative for chest pain, palpitations and leg swelling.  Gastrointestinal: Negative for nausea, abdominal pain, diarrhea, constipation and abdominal distention.  Genitourinary: Negative.   Musculoskeletal: Negative.   Skin: Negative.   Neurological: Negative for dizziness, weakness, light-headedness, numbness and headaches.  Psychiatric/Behavioral: Negative.       Objective:   Physical Exam  Constitutional: He is oriented to person, place, and time. He appears well-developed and well-nourished.  HENT:  Head: Normocephalic and atraumatic.  Eyes: EOM are normal.  Neck: Normal range of motion.  Cardiovascular: Normal rate and regular rhythm.   No murmur heard. Carotids without bruits  Pulmonary/Chest: Effort normal and breath sounds normal. No respiratory distress. He has no wheezes. He has no rales.  Abdominal: Soft. Bowel sounds are normal. He exhibits no distension. There is no tenderness. There is no rebound.  Neurological: He is alert and oriented to person, place, and time. Coordination normal.  Skin: Skin is warm and dry.  Psychiatric: He has a normal mood and affect.   Filed Vitals:   07/05/15 0830  BP: 116/60  Pulse: 80  Temp: 98.2 F (36.8 C)  TempSrc: Oral  Resp: 16  Height: 6\' 3"  (1.905 m)  Weight: 272 lb (123.378 kg)  SpO2: 97%      Assessment & Plan:

## 2015-07-05 NOTE — Assessment & Plan Note (Signed)
Checking lipid panel, not on meds at this time. Is losing weight which will help.

## 2015-07-05 NOTE — Patient Instructions (Signed)
We will check the blood work today and call you back with the results.  Keep up the good work with exercise and keep working on getting rid of the cigarettes as these increase your risk of heart attack and stroke.  Health Maintenance, Male A healthy lifestyle and preventative care can promote health and wellness.  Maintain regular health, dental, and eye exams.  Eat a healthy diet. Foods like vegetables, fruits, whole grains, low-fat dairy products, and lean protein foods contain the nutrients you need and are low in calories. Decrease your intake of foods high in solid fats, added sugars, and salt. Get information about a proper diet from your health care provider, if necessary.  Regular physical exercise is one of the most important things you can do for your health. Most adults should get at least 150 minutes of moderate-intensity exercise (any activity that increases your heart rate and causes you to sweat) each week. In addition, most adults need muscle-strengthening exercises on 2 or more days a week.   Maintain a healthy weight. The body mass index (BMI) is a screening tool to identify possible weight problems. It provides an estimate of body fat based on height and weight. Your health care provider can find your BMI and can help you achieve or maintain a healthy weight. For males 20 years and older:  A BMI below 18.5 is considered underweight.  A BMI of 18.5 to 24.9 is normal.  A BMI of 25 to 29.9 is considered overweight.  A BMI of 30 and above is considered obese.  Maintain normal blood lipids and cholesterol by exercising and minimizing your intake of saturated fat. Eat a balanced diet with plenty of fruits and vegetables. Blood tests for lipids and cholesterol should begin at age 26 and be repeated every 5 years. If your lipid or cholesterol levels are high, you are over age 67, or you are at high risk for heart disease, you may need your cholesterol levels checked more  frequently.Ongoing high lipid and cholesterol levels should be treated with medicines if diet and exercise are not working.  If you smoke, find out from your health care provider how to quit. If you do not use tobacco, do not start.  Lung cancer screening is recommended for adults aged 90-80 years who are at high risk for developing lung cancer because of a history of smoking. A yearly low-dose CT scan of the lungs is recommended for people who have at least a 30-pack-year history of smoking and are current smokers or have quit within the past 15 years. A pack year of smoking is smoking an average of 1 pack of cigarettes a day for 1 year (for example, a 30-pack-year history of smoking could mean smoking 1 pack a day for 30 years or 2 packs a day for 15 years). Yearly screening should continue until the smoker has stopped smoking for at least 15 years. Yearly screening should be stopped for people who develop a health problem that would prevent them from having lung cancer treatment.  If you choose to drink alcohol, do not have more than 2 drinks per day. One drink is considered to be 12 oz (360 mL) of beer, 5 oz (150 mL) of wine, or 1.5 oz (45 mL) of liquor.  Avoid the use of street drugs. Do not share needles with anyone. Ask for help if you need support or instructions about stopping the use of drugs.  High blood pressure causes heart disease and increases the  risk of stroke. High blood pressure is more likely to develop in:  People who have blood pressure in the end of the normal range (100-139/85-89 mm Hg).  People who are overweight or obese.  People who are African American.  If you are 39-26 years of age, have your blood pressure checked every 3-5 years. If you are 65 years of age or older, have your blood pressure checked every year. You should have your blood pressure measured twice--once when you are at a hospital or clinic, and once when you are not at a hospital or clinic. Record the  average of the two measurements. To check your blood pressure when you are not at a hospital or clinic, you can use:  An automated blood pressure machine at a pharmacy.  A home blood pressure monitor.  If you are 68-68 years old, ask your health care provider if you should take aspirin to prevent heart disease.  Diabetes screening involves taking a blood sample to check your fasting blood sugar level. This should be done once every 3 years after age 24 if you are at a normal weight and without risk factors for diabetes. Testing should be considered at a younger age or be carried out more frequently if you are overweight and have at least 1 risk factor for diabetes.  Colorectal cancer can be detected and often prevented. Most routine colorectal cancer screening begins at the age of 42 and continues through age 20. However, your health care provider may recommend screening at an earlier age if you have risk factors for colon cancer. On a yearly basis, your health care provider may provide home test kits to check for hidden blood in the stool. A small camera at the end of a tube may be used to directly examine the colon (sigmoidoscopy or colonoscopy) to detect the earliest forms of colorectal cancer. Talk to your health care provider about this at age 87 when routine screening begins. A direct exam of the colon should be repeated every 5-10 years through age 57, unless early forms of precancerous polyps or small growths are found.  People who are at an increased risk for hepatitis B should be screened for this virus. You are considered at high risk for hepatitis B if:  You were born in a country where hepatitis B occurs often. Talk with your health care provider about which countries are considered high risk.  Your parents were born in a high-risk country and you have not received a shot to protect against hepatitis B (hepatitis B vaccine).  You have HIV or AIDS.  You use needles to inject street  drugs.  You live with, or have sex with, someone who has hepatitis B.  You are a man who has sex with other men (MSM).  You get hemodialysis treatment.  You take certain medicines for conditions like cancer, organ transplantation, and autoimmune conditions.  Hepatitis C blood testing is recommended for all people born from [birth year] through 1967 and any individual with known risk factors for hepatitis C.  Healthy men should no longer receive prostate-specific antigen (PSA) blood tests as part of routine cancer screening. Talk to your health care provider about prostate cancer screening.  Testicular cancer screening is not recommended for adolescents or adult males who have no symptoms. Screening includes self-exam, a health care provider exam, and other screening tests. Consult with your health care provider about any symptoms you have or any concerns you have about testicular cancer.  Practice safe  sex. Use condoms and avoid high-risk sexual practices to reduce the spread of sexually transmitted infections (STIs).  You should be screened for STIs, including gonorrhea and chlamydia if:  You are sexually active and are younger than 24 years.  You are older than 24 years, and your health care provider tells you that you are at risk for this type of infection.  Your sexual activity has changed since you were last screened, and you are at an increased risk for chlamydia or gonorrhea. Ask your health care provider if you are at risk.  If you are at risk of being infected with HIV, it is recommended that you take a prescription medicine daily to prevent HIV infection. This is called pre-exposure prophylaxis (PrEP). You are considered at risk if:  You are a man who has sex with other men (MSM).  You are a heterosexual man who is sexually active with multiple partners.  You take drugs by injection.  You are sexually active with a partner who has HIV.  Talk with your health care provider about  whether you are at high risk of being infected with HIV. If you choose to begin PrEP, you should first be tested for HIV. You should then be tested every 3 months for as long as you are taking PrEP.  Use sunscreen. Apply sunscreen liberally and repeatedly throughout the day. You should seek shade when your shadow is shorter than you. Protect yourself by wearing long sleeves, pants, a wide-brimmed hat, and sunglasses year round whenever you are outdoors.  Tell your health care provider of new moles or changes in moles, especially if there is a change in shape or color. Also, tell your health care provider if a mole is larger than the size of a pencil eraser.  A one-time screening for abdominal aortic aneurysm (AAA) and surgical repair of large AAAs by ultrasound is recommended for men aged 33-75 years who are current or former smokers.  Stay current with your vaccines (immunizations).   This information is not intended to replace advice given to you by your health care provider. Make sure you discuss any questions you have with your health care provider.   Document Released: 11/03/2007 Document Revised: 05/28/2014 Document Reviewed: 10/02/2010 Elsevier Interactive Patient Education [employer].

## 2015-07-06 LAB — HEPATITIS C ANTIBODY: HCV Ab: NEGATIVE

## 2015-07-26 ENCOUNTER — Encounter: Payer: Self-pay | Admitting: Podiatry

## 2015-07-26 ENCOUNTER — Ambulatory Visit (INDEPENDENT_AMBULATORY_CARE_PROVIDER_SITE_OTHER): Payer: BC Managed Care – PPO | Admitting: Podiatry

## 2015-07-26 ENCOUNTER — Ambulatory Visit (INDEPENDENT_AMBULATORY_CARE_PROVIDER_SITE_OTHER): Payer: BC Managed Care – PPO

## 2015-07-26 VITALS — BP 137/76 | HR 83 | Resp 16

## 2015-07-26 DIAGNOSIS — M205X9 Other deformities of toe(s) (acquired), unspecified foot: Secondary | ICD-10-CM

## 2015-07-26 DIAGNOSIS — M2022 Hallux rigidus, left foot: Secondary | ICD-10-CM | POA: Diagnosis not present

## 2015-07-26 DIAGNOSIS — M2021 Hallux rigidus, right foot: Secondary | ICD-10-CM | POA: Diagnosis not present

## 2015-07-26 MED ORDER — MELOXICAM 15 MG PO TABS: 15.0000 mg | ORAL_TABLET | Freq: Every day | ORAL | Status: DC

## 2015-07-26 NOTE — Patient Instructions (Signed)
Pre-Operative Instructions  Congratulations, you have decided to take an important step to improving your quality of life.  You can be assured that the doctors of [facility] will be with you every step of the way.  1. Plan to be at the surgery center/hospital at least 1 (one) hour prior to your scheduled time unless otherwise directed by the surgical center/hospital staff.  You must have a responsible adult accompany you, remain during the surgery and drive you home.  Make sure you have directions to the surgical center/hospital and know how to get there on time. 2. For hospital based surgery you will need to obtain a history and physical form from your family physician within 1 month prior to the date of surgery- we will give you a form for you primary physician.  3. We make every effort to accommodate the date you request for surgery.  There are however, times where surgery dates or times have to be moved.  We will contact you as soon as possible if a change in schedule is required.   4. No Aspirin/Ibuprofen for one week before surgery.  If you are on aspirin, any non-steroidal anti-inflammatory medications (Mobic, Aleve, Ibuprofen) you should stop taking it 7 days prior to your surgery.  You make take Tylenol  For pain prior to surgery.  5. Medications- If you are taking daily heart and blood pressure medications, seizure, reflux, allergy, asthma, anxiety, pain or diabetes medications, make sure the surgery center/hospital is aware before the day of surgery so they may notify you which medications to take or avoid the day of surgery. 6. No food or drink after midnight the night before surgery unless directed otherwise by surgical center/hospital staff. 7. No alcoholic beverages 24 hours prior to surgery.  No smoking 24 hours prior to or 24 hours after surgery. 8. Wear loose pants or shorts- loose enough to fit over bandages, boots, and casts. 9. No slip on shoes, sneakers are best. 10. Bring  your boot with you to the surgery center/hospital.  Also bring crutches or a walker if your physician has prescribed it for you.  If you do not have this equipment, it will be provided for you after surgery. 11. If you have not been contracted by the surgery center/hospital by the day before your surgery, call to confirm the date and time of your surgery. 12. Leave-time from work may vary depending on the type of surgery you have.  Appropriate arrangements should be made prior to surgery with your employer. 13. Prescriptions will be provided immediately following surgery by your doctor.  Have these filled as soon as possible after surgery and take the medication as directed. 14. Remove nail polish on the operative foot. 15. Wash the night before surgery.  The night before surgery wash the foot and leg well with the antibacterial soap provided and water paying special attention to beneath the toenails and in between the toes.  Rinse thoroughly with water and dry well with a towel.  Perform this wash unless told not to do so by your physician.  Enclosed: 1 Ice pack (please put in freezer the night before surgery)   1 Hibiclens skin cleaner   Pre-op Instructions  If you have any questions regarding the instructions, do not hesitate to call our office.  [place]: [address] , [place] [address] [phone]  [place]: [address] [place], [place] [address] [phone]  [place]: [address]  [place],  [address] [phone]  Dr. Richard   Tuchman DPM, Dr. Norman Regal DPM Dr. Richard Sikora DPM, Dr. M. Todd Hyatt DPM, Dr. Kathryn Egerton DPM 

## 2015-07-26 NOTE — Progress Notes (Signed)
   Subjective:    Patient ID: Clifford Newman, male    DOB: [birth year], 55 y.o.   MRN: [id number]  HPI: He presents today with chief complaint of painful forefoot bilateral. Left greater than right. 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 He states that he think the police shoes messed his feet up. He is also concerned about toenails to curl under on the second toes bilaterally. He states his feet have been hurting for years and seem to be getting worse he states that his heart squat down or go up on his toes. He states that is starting to affect his ability to perform his daily activities.    Review of Systems  All other systems reviewed and are negative.      Objective:   Physical Exam: I have reviewed his past medical history medications allergies surgeries and social history. Vital signs are stable he is alert and oriented 3 no distress. Pulses are strongly palpable neurologic sensorium is intact per Semmes-Weinstein monofilament. Deep tendon reflexes are intact bilateral and muscle strength +5 over 5 dorsiflexion plantar flexors and inverters everters all into the musculature is intact. Orthopedic evaluation demonstrates pes planus bilaterally with an elevated first metatarsal resulting in hallux rigidus of the first metatarsophalangeal joints bilaterally. Left foot does demonstrate thickening of the first metatarsophalangeal joint with dorsal and medial spurring. Both of his first metatarsophalangeal joint painful. Radiographs confirm complete joint loss of the first metatarsophalangeal joints with pes planus. Cutaneous evaluation demonstrates supple well-hydrated cutis no erythema edema saline as drainage or odor. No open lesions or wounds.        Assessment & Plan:  Hallux limitus/hallux rigidus first metatarsophalangeal joint bilateral left greater than right.  Plan: Discussed etiology pathology conservative versus surgical therapies. At this point we  consented him for a Keller arthroplasty with a single silicone implant left foot. I answered his questions to the best of my ability in layman's terms. He understood this was amenable to it. We did discuss the possible postop complications which may include but are not limited to postop pain bleeding swelling infection recurrence need for further surgery over correction under correction loss of digit loss of limb loss of life. We dispensed a cam walker for his postop recovery period. And I will follow-up with him in the near future for surgery.

## 2015-08-09 ENCOUNTER — Telehealth: Payer: Self-pay | Admitting: *Deleted

## 2015-08-09 NOTE — Telephone Encounter (Signed)
Ok and did you cancel his post op visits?

## 2015-08-09 NOTE — Telephone Encounter (Signed)
Theo Dills Stated patient called and stated, "I want to cancel my surgery for 09/30/2015.  I may reschedule in the fall.  Call if you have any questions."  I'm returning your call.  You want to cancel your surgery scheduled for 05/12/20017.  "Yes, I would.  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  I'm not sure how long he will be out.  So, I didn't want to put my company in any hardship.  I decided to just get it done in the fall."  Would you like to schedule it for the fall?  "No, I'll just call later to get it rescheduled."  I called and left a message for Caren Griffins at [facility] to cancel surgery.

## 2015-09-29 ENCOUNTER — Telehealth: Payer: Self-pay | Admitting: *Deleted

## 2015-09-29 NOTE — Telephone Encounter (Signed)
Calling to request the clinical information for his upcoming surgery.  Could you please fax that information to [phone]?  On the cover sheet please put the service reference number [id number].

## 2015-10-06 ENCOUNTER — Encounter: Payer: Self-pay | Admitting: Podiatry

## 2015-10-06 NOTE — Telephone Encounter (Signed)
I called and informed Clifford Newman that patient canceled his surgery.  He said he wants to wait until the fall due to work issues.  "Okay, I'll make a note of it."

## 2015-12-20 ENCOUNTER — Ambulatory Visit: Payer: BC Managed Care – PPO | Admitting: Internal Medicine

## 2015-12-29 ENCOUNTER — Ambulatory Visit (INDEPENDENT_AMBULATORY_CARE_PROVIDER_SITE_OTHER): Payer: BC Managed Care – PPO | Admitting: Internal Medicine

## 2015-12-29 ENCOUNTER — Encounter: Payer: Self-pay | Admitting: Internal Medicine

## 2015-12-29 DIAGNOSIS — M7062 Trochanteric bursitis, left hip: Secondary | ICD-10-CM | POA: Diagnosis not present

## 2015-12-29 MED ORDER — CYCLOBENZAPRINE HCL 5 MG PO TABS: 5.0000 mg | ORAL_TABLET | Freq: Two times a day (BID) | ORAL | 1 refills | Status: DC | PRN

## 2015-12-29 NOTE — Progress Notes (Signed)
   Subjective:    Patient ID: Clifford Newman, male    DOB: 25-Oct-1960, 55 y.o.   MRN: [id number]  HPI The patient is a 55 YO man coming in for low back and hip pain for several months. Started insidiously without trigger incident. No injury or skin changes. Has not really tried anything for the pain as it is not severe. Some improvement with increase in exercise. Hurts when he lies on it at night time. Hurts on the side of his hip and the low back. No pain going down into his legs or numbness. No fevers or chills or weight change. Sometimes worse when he is constipated although he states that is rare. Taking meloxicam for his feet and this does not help much with his pain.   Review of Systems  Constitutional: Negative for activity change, appetite change, chills, fatigue, fever and unexpected weight change.  Respiratory: Negative for cough, chest tightness, shortness of breath and wheezing.   Cardiovascular: Negative for chest pain, palpitations and leg swelling.  Gastrointestinal: Negative for abdominal distention, abdominal pain, constipation, diarrhea and nausea.  Musculoskeletal: Positive for back pain and myalgias. Negative for gait problem, neck pain and neck stiffness.  Neurological: Negative for dizziness, weakness, light-headedness, numbness and headaches.      Objective:   Physical Exam  Constitutional: He is oriented to person, place, and time. He appears well-developed and well-nourished.  HENT:  Head: Normocephalic and atraumatic.  Eyes: EOM are normal.  Neck: Normal range of motion.  Cardiovascular: Normal rate and regular rhythm.   No murmur heard. Carotids without bruits  Pulmonary/Chest: Effort normal and breath sounds normal. No respiratory distress. He has no wheezes. He has no rales.  Abdominal: Soft. He exhibits no distension. There is no tenderness. There is no rebound.  Musculoskeletal: He exhibits tenderness.  Pain in the paraspinal region left lumbar and into the  left trochanteric bursa, no pain in the groin area.   Neurological: He is alert and oriented to person, place, and time. Coordination normal.  Skin: Skin is warm and dry.  Psychiatric: He has a normal mood and affect.   Vitals:   12/29/15 1049  BP: 132/82  Pulse: 77  Resp: 12  Temp: 98.3 F (36.8 C)  TempSrc: Oral  SpO2: 98%  Weight: 263 lb (119.3 kg)  Height: 6\' 3"  (1.905 m)      Assessment & Plan:

## 2015-12-29 NOTE — Progress Notes (Signed)
Pre visit review using our clinic review tool, if applicable. No additional management support is needed unless otherwise documented below in the visit note. 

## 2015-12-29 NOTE — Patient Instructions (Signed)
We have sent in some flexeril that you can use for severe pain in the hip and back. Below we have included some directions for helping to get rid of the pain. Some people end up needing injections to help the pain so if you are not improving call us for more advice.    Trochanteric Bursitis You have hip pain due to trochanteric bursitis. Bursitis means that the sack near the outside of the hip is filled with fluid and inflamed. This sack is made up of protective soft tissue. The pain from trochanteric bursitis can be severe and keep you from sleep. It can radiate to the buttocks or down the outside of the thigh to the knee. The pain is almost always worse when rising from the seated or lying position and with walking. Pain can improve after you take a few steps. It happens more often in people with hip joint and lumbar spine problems, such as arthritis or previous surgery. Very rarely the trochanteric bursa can become infected, and antibiotics and/or surgery may be needed. Treatment often includes an injection of local anesthetic mixed with cortisone medicine. This medicine is injected into the area where it is most tender over the hip. Repeat injections may be necessary if the response to treatment is slow. You can apply ice packs over the tender area for 30 minutes every 2 hours for the next few days. Anti-inflammatory and/or narcotic pain medicine may also be helpful. Limit your activity for the next few days if the pain continues. See your caregiver in 5-10 days if you are not greatly improved.  SEEK IMMEDIATE MEDICAL CARE IF:  You develop severe pain, fever, or increased redness.  You have pain that radiates below the knee. EXERCISES STRETCHING EXERCISES - Trochanteric Bursitis  These exercises may help you when beginning to rehabilitate your injury. Your symptoms may resolve with or without further involvement from your physician, physical therapist, or athletic trainer. While completing these  exercises, remember:   Restoring tissue flexibility helps normal motion to return to the joints. This allows healthier, less painful movement and activity.  An effective stretch should be held for at least 30 seconds.  A stretch should never be painful. You should only feel a gentle lengthening or release in the stretched tissue. STRETCH - Iliotibial Band  On the floor or bed, lie on your side so your injured leg is on top. Bend your knee and grab your ankle.  Slowly bring your knee back so that your thigh is in line with your trunk. Keep your heel at your buttocks and gently arch your back so your head, shoulders and hips line up.  Slowly lower your leg so that your knee approaches the floor/bed until you feel a gentle stretch on the outside of your thigh. If you do not feel a stretch and your knee will not fall farther, place the heel of your opposite foot on top of your knee and pull your thigh down farther.  Hold this stretch for __________ seconds.  Repeat __________ times. Complete this exercise __________ times per day. STRETCH - Hamstrings, Supine   Lie on your back. Loop a belt or towel over the ball of your foot as shown.  Straighten your knee and slowly pull on the belt to raise your injured leg. Do not allow the knee to bend. Keep your opposite leg flat on the floor.  Raise the leg until you feel a gentle stretch behind your knee or thigh. Hold this position for  __________ seconds.  Repeat __________ times. Complete this stretch __________ times per day. STRETCH - Quadriceps, Prone   Lie on your stomach on a firm surface, such as a bed or padded floor.  Bend your knee and grasp your ankle. If you are unable to reach your ankle or pant leg, use a belt around your foot to lengthen your reach.  Gently pull your heel toward your buttocks. Your knee should not slide out to the side. You should feel a stretch in the front of your thigh and/or knee.  Hold this position for  __________ seconds.  Repeat __________ times. Complete this stretch __________ times per day. STRETCHING - Hip Flexors, Lunge Half kneel with your knee on the floor and your opposite knee bent and directly over your ankle.  Keep good posture with your head over your shoulders. Tighten your buttocks to point your tailbone downward; this will prevent your back from arching too much.  You should feel a gentle stretch in the front of your thigh and/or hip. If you do not feel any resistance, slightly slide your opposite foot forward and then slowly lunge forward so your knee once again lines up over your ankle. Be sure your tailbone remains pointed downward.  Hold this stretch for __________ seconds.  Repeat __________ times. Complete this stretch __________ times per day. STRETCH - Adductors, Lunge  While standing, spread your legs.  Lean away from your injured leg by bending your opposite knee. You may rest your hands on your thigh for balance.  You should feel a stretch in your inner thigh. Hold for __________ seconds.  Repeat __________ times. Complete this exercise __________ times per day.   This information is not intended to replace advice given to you by your health care provider. Make sure you discuss any questions you have with your health care provider.   Document Released: 06/14/2004 Document Revised: 09/21/2014 Document Reviewed: 08/19/2008 Elsevier Interactive Patient Education [employer].

## 2015-12-29 NOTE — Assessment & Plan Note (Signed)
Rx for flexeril for pain as the meloxicam he is taking is not helping. Given stretching exercises that he will do and call back if not improved. No indication for imaging today.

## 2016-01-11 ENCOUNTER — Telehealth: Payer: Self-pay | Admitting: Internal Medicine

## 2016-01-11 DIAGNOSIS — F329 Major depressive disorder, single episode, unspecified: Secondary | ICD-10-CM

## 2016-01-11 DIAGNOSIS — F32A Depression, unspecified: Secondary | ICD-10-CM

## 2016-01-11 NOTE — Telephone Encounter (Signed)
Patient called to request a referral to a counselor- for depression sx

## 2016-01-12 NOTE — Telephone Encounter (Signed)
Have placed referral but he can go wherever he wants by just calling the office to make sure they take his insurance.

## 2016-08-10 ENCOUNTER — Other Ambulatory Visit (INDEPENDENT_AMBULATORY_CARE_PROVIDER_SITE_OTHER): Payer: BC Managed Care – PPO

## 2016-08-10 ENCOUNTER — Encounter: Payer: Self-pay | Admitting: Internal Medicine

## 2016-08-10 ENCOUNTER — Ambulatory Visit (INDEPENDENT_AMBULATORY_CARE_PROVIDER_SITE_OTHER): Payer: BC Managed Care – PPO | Admitting: Internal Medicine

## 2016-08-10 VITALS — BP 130/80 | HR 86 | Temp 98.4°F | Resp 14 | Ht 75.0 in | Wt 263.8 lb

## 2016-08-10 DIAGNOSIS — Z720 Tobacco use: Secondary | ICD-10-CM | POA: Diagnosis not present

## 2016-08-10 DIAGNOSIS — Z0000 Encounter for general adult medical examination without abnormal findings: Secondary | ICD-10-CM | POA: Diagnosis not present

## 2016-08-10 DIAGNOSIS — E785 Hyperlipidemia, unspecified: Secondary | ICD-10-CM

## 2016-08-10 LAB — CBC
HCT: 48.4 % (ref 39.0–52.0)
Hemoglobin: 16.3 g/dL (ref 13.0–17.0)
MCHC: 33.7 g/dL (ref 30.0–36.0)
MCV: 90.4 fl (ref 78.0–100.0)
Platelets: 194 10*3/uL (ref 150.0–400.0)
RBC: 5.36 Mil/uL (ref 4.22–5.81)
RDW: 14 % (ref 11.5–15.5)
WBC: 4.1 10*3/uL (ref 4.0–10.5)

## 2016-08-10 LAB — LIPID PANEL
Cholesterol: 223 mg/dL — ABNORMAL HIGH (ref 0–200)
HDL: 55.9 mg/dL (ref >39.00)
LDL Cholesterol: 154 mg/dL — ABNORMAL HIGH (ref 0–99)
NonHDL: 167.44
Total CHOL/HDL Ratio: 4
Triglycerides: 66 mg/dL (ref 0.0–149.0)
VLDL: 13.2 mg/dL (ref 0.0–40.0)

## 2016-08-10 LAB — COMPREHENSIVE METABOLIC PANEL
ALT: 17 U/L (ref 0–53)
AST: 18 U/L (ref 0–37)
Albumin: 4.3 g/dL (ref 3.5–5.2)
Alkaline Phosphatase: 38 U/L — ABNORMAL LOW (ref 39–117)
BUN: 14 mg/dL (ref 6–23)
CO2: 25 mEq/L (ref 19–32)
Calcium: 9.8 mg/dL (ref 8.4–10.5)
Chloride: 107 mEq/L (ref 96–112)
Creatinine, Ser: 1.02 mg/dL (ref 0.40–1.50)
GFR: 96.51 mL/min (ref >60.00)
Glucose, Bld: 108 mg/dL — ABNORMAL HIGH (ref 70–99)
Potassium: 3.9 mEq/L (ref 3.5–5.1)
Sodium: 137 mEq/L (ref 135–145)
Total Bilirubin: 0.5 mg/dL (ref 0.2–1.2)
Total Protein: 7.2 g/dL (ref 6.0–8.3)

## 2016-08-10 LAB — HEMOGLOBIN A1C: Hgb A1c MFr Bld: 6 % (ref 4.6–6.5)

## 2016-08-10 MED ORDER — SILDENAFIL CITRATE 100 MG PO TABS: 50.0000 mg | ORAL_TABLET | Freq: Every day | ORAL | 11 refills | Status: DC | PRN

## 2016-08-10 MED ORDER — PHENTERMINE HCL 37.5 MG PO CAPS: 37.5000 mg | ORAL_CAPSULE | ORAL | 3 refills | Status: DC

## 2016-08-10 NOTE — Assessment & Plan Note (Signed)
Has started smoking again and is working on quitting. Would like to do patches to quit when ready. Reminded of the need to quit and about risks and harms from smoking.

## 2016-08-10 NOTE — Assessment & Plan Note (Signed)
Checking lipid panel for goal LDL<130. Is exercising now which should help.

## 2016-08-10 NOTE — Progress Notes (Signed)
   Subjective:    Patient ID: Clifford Newman, male    DOB: 1960-09-12, 56 y.o.   MRN: [id number]  HPI The patient is a 56 YO man coming in for wellness. No new concerns. Has relapsed on cigarettes in the last year.  PMH, [facility], social history reviewed and updated.   Review of Systems  Constitutional: Negative.   HENT: Negative.   Eyes: Negative.   Respiratory: Negative for cough, chest tightness and shortness of breath.   Cardiovascular: Negative for chest pain, palpitations and leg swelling.  Gastrointestinal: Negative for abdominal distention, abdominal pain, constipation, diarrhea, nausea and vomiting.  Musculoskeletal: Negative.   Skin: Negative.   Neurological: Negative.   Psychiatric/Behavioral: Negative.       Objective:   Physical Exam  Constitutional: He is oriented to person, place, and time. He appears well-developed and well-nourished.  HENT:  Head: Normocephalic and atraumatic.  Eyes: EOM are normal.  Neck: Normal range of motion.  Cardiovascular: Normal rate and regular rhythm.   Pulmonary/Chest: Effort normal and breath sounds normal. No respiratory distress. He has no wheezes. He has no rales.  Abdominal: Soft. Bowel sounds are normal. He exhibits no distension. There is no tenderness. There is no rebound.  Musculoskeletal: He exhibits no edema.  Neurological: He is alert and oriented to person, place, and time. Coordination normal.  Skin: Skin is warm and dry.  Psychiatric: He has a normal mood and affect.   Vitals:   08/10/16 0758  BP: 130/80  Pulse: 86  Resp: 14  Temp: 98.4 F (36.9 C)  TempSrc: Oral  SpO2: 99%  Weight: 263 lb 12 oz (119.6 kg)  Height: 6\' 3"  (1.905 m)      Assessment & Plan:

## 2016-08-10 NOTE — Progress Notes (Signed)
Pre visit review using our clinic review tool, if applicable. No additional management support is needed unless otherwise documented below in the visit note. 

## 2016-08-10 NOTE — Patient Instructions (Addendum)
We will check the labs today.   We have sent in the refill of the viagra and given you the phentermine medication. Take 1 pill daily to help boost the weight loss. Come back in about 3 months to check in on how it is working for you.   The most common side effects with this medicine are headaches and sometimes heart racing. If you get bothersome side effects stop taking the medicine and call us back.    Health Maintenance, Male A healthy lifestyle and preventive care is important for your health and wellness. Ask your health care provider about what schedule of regular examinations is right for you. What should I know about weight and diet?  Eat a Healthy Diet  Eat plenty of vegetables, fruits, whole grains, low-fat dairy products, and lean protein.  Do not eat a lot of foods high in solid fats, added sugars, or salt. Maintain a Healthy Weight  Regular exercise can help you achieve or maintain a healthy weight. You should:  Do at least 150 minutes of exercise each week. The exercise should increase your heart rate and make you sweat (moderate-intensity exercise).  Do strength-training exercises at least twice a week. Watch Your Levels of Cholesterol and Blood Lipids  Have your blood tested for lipids and cholesterol every 5 years starting at 58 years of age. If you are at high risk for heart disease, you should start having your blood tested when you are 58 years old. You may need to have your cholesterol levels checked more often if:  Your lipid or cholesterol levels are high.  You are older than 58 years of age.  You are at high risk for heart disease. What should I know about cancer screening? Many types of cancers can be detected early and may often be prevented. Lung Cancer  You should be screened every year for lung cancer if:  You are a current smoker who has smoked for at least 30 years.  You are a former smoker who has quit within the past 15 years.  Talk to your  health care provider about your screening options, when you should start screening, and how often you should be screened. Colorectal Cancer  Routine colorectal cancer screening usually begins at 58 years of age and should be repeated every 5-10 years until you are 58 years old. You may need to be screened more often if early forms of precancerous polyps or small growths are found. Your health care provider may recommend screening at an earlier age if you have risk factors for colon cancer.  Your health care provider may recommend using home test kits to check for hidden blood in the stool.  A small camera at the end of a tube can be used to examine your colon (sigmoidoscopy or colonoscopy). This checks for the earliest forms of colorectal cancer. Prostate and Testicular Cancer  Depending on your age and overall health, your health care provider may do certain tests to screen for prostate and testicular cancer.  Talk to your health care provider about any symptoms or concerns you have about testicular or prostate cancer. Skin Cancer  Check your skin from head to toe regularly.  Tell your health care provider about any new moles or changes in moles, especially if:  There is a change in a mole's size, shape, or color.  You have a mole that is larger than a pencil eraser.  Always use sunscreen. Apply sunscreen liberally and repeat throughout the day.  Protect yourself by wearing long sleeves, pants, a wide-brimmed hat, and sunglasses when outside. What should I know about heart disease, diabetes, and high blood pressure?  If you are 22-12 years of age, have your blood pressure checked every 3-5 years. If you are 31 years of age or older, have your blood pressure checked every year. You should have your blood pressure measured twice-once when you are at a hospital or clinic, and once when you are not at a hospital or clinic. Record the average of the two measurements. To check your blood  pressure when you are not at a hospital or clinic, you can use:  An automated blood pressure machine at a pharmacy.  A home blood pressure monitor.  Talk to your health care provider about your target blood pressure.  If you are between 91-17 years old, ask your health care provider if you should take aspirin to prevent heart disease.  Have regular diabetes screenings by checking your fasting blood sugar level.  If you are at a normal weight and have a low risk for diabetes, have this test once every three years after the age of 65.  If you are overweight and have a high risk for diabetes, consider being tested at a younger age or more often.  A one-time screening for abdominal aortic aneurysm (AAA) by ultrasound is recommended for men aged 85-75 years who are current or former smokers. What should I know about preventing infection? Hepatitis B  If you have a higher risk for hepatitis B, you should be screened for this virus. Talk with your health care provider to find out if you are at risk for hepatitis B infection. Hepatitis C  Blood testing is recommended for:  Everyone born from [birth year] through 1967.  Anyone with known risk factors for hepatitis C. Sexually Transmitted Diseases (STDs)  You should be screened each year for STDs including gonorrhea and chlamydia if:  You are sexually active and are younger than 58 years of age.  You are older than 58 years of age and your health care provider tells you that you are at risk for this type of infection.  Your sexual activity has changed since you were last screened and you are at an increased risk for chlamydia or gonorrhea. Ask your health care provider if you are at risk.  Talk with your health care provider about whether you are at high risk of being infected with HIV. Your health care provider may recommend a prescription medicine to help prevent HIV infection. What else can I do?  Schedule regular health, dental, and eye  exams.  Stay current with your vaccines (immunizations).  Do not use any tobacco products, such as cigarettes, chewing tobacco, and e-cigarettes. If you need help quitting, ask your health care provider.  Limit alcohol intake to no more than 2 drinks per day. One drink equals 12 ounces of beer, 5 ounces of wine, or 1 ounces of hard liquor.  Do not use street drugs.  Do not share needles.  Ask your health care provider for help if you need support or information about quitting drugs.  Tell your health care provider if you often feel depressed.  Tell your health care provider if you have ever been abused or do not feel safe at home. This information is not intended to replace advice given to you by your health care provider. Make sure you discuss any questions you have with your health care provider. Document Released: 11/03/2007 Document Revised:  01/04/2016 Document Reviewed: 02/08/2015 Elsevier Interactive Patient Education  2017 [employer].

## 2016-08-10 NOTE — Assessment & Plan Note (Signed)
Colonoscopy due 2021, tetanus up to date. Missed flu shot this year although normally takes. Counseled on diet and exercise. Counseled on the dangers of distracted driving. Given screening recommendations.

## 2016-08-11 ENCOUNTER — Other Ambulatory Visit: Payer: Self-pay | Admitting: Internal Medicine

## 2016-08-19 ENCOUNTER — Encounter: Payer: Self-pay | Admitting: Internal Medicine

## 2016-10-31 ENCOUNTER — Encounter: Payer: Self-pay | Admitting: Nurse Practitioner

## 2016-10-31 ENCOUNTER — Ambulatory Visit (INDEPENDENT_AMBULATORY_CARE_PROVIDER_SITE_OTHER): Payer: BC Managed Care – PPO | Admitting: Nurse Practitioner

## 2016-10-31 ENCOUNTER — Ambulatory Visit: Payer: BC Managed Care – PPO | Admitting: Internal Medicine

## 2016-10-31 VITALS — BP 118/76 | HR 92 | Temp 98.1°F | Ht 75.0 in | Wt 261.0 lb

## 2016-10-31 DIAGNOSIS — E6609 Other obesity due to excess calories: Secondary | ICD-10-CM | POA: Diagnosis not present

## 2016-10-31 DIAGNOSIS — G8929 Other chronic pain: Secondary | ICD-10-CM

## 2016-10-31 DIAGNOSIS — Z6832 Body mass index (BMI) 32.0-32.9, adult: Secondary | ICD-10-CM | POA: Diagnosis not present

## 2016-10-31 DIAGNOSIS — M79673 Pain in unspecified foot: Secondary | ICD-10-CM

## 2016-10-31 MED ORDER — PHENTERMINE HCL 37.5 MG PO CAPS: 37.5000 mg | ORAL_CAPSULE | ORAL | 3 refills | Status: DC

## 2016-10-31 MED ORDER — IBUPROFEN-FAMOTIDINE 800-26.6 MG PO TABS: 1.0000 | ORAL_TABLET | Freq: Three times a day (TID) | ORAL | 3 refills | Status: DC | PRN

## 2016-10-31 NOTE — Progress Notes (Signed)
Subjective:  Patient ID: Clifford Newman, male    DOB: Oct 11, 1960  Age: 56 y.o. MRN: [id number]  CC: Follow-up (follow up/artheritis on feet--med consult. had paper that didnt finish when Dr. Sharlet Salina did it--just 1 section that didnt get answer about forst parent paper work)   HPI  Weight loss management: Denies any adverse side effects with phentermine. Exercises 3x/week at gym.  Foot pain: Bilateral, chronic, diffuse pain, no numbness or tingling. Pain is intermittent with prolong standing or walking. Denies any joint swelling or redness. Will like to try Duexis for pain. Denies any injury.  Outpatient Medications Prior to Visit  Medication Sig Dispense Refill  . cyclobenzaprine (FLEXERIL) 5 MG tablet Take 1 tablet (5 mg total) by mouth 2 (two) times daily as needed for muscle spasms (pain). 30 tablet 1  . sildenafil (VIAGRA) 100 MG tablet Take 0.5-1 tablets (50-100 mg total) by mouth daily as needed for erectile dysfunction. 10 tablet 11  . phentermine 37.5 MG capsule Take 1 capsule (37.5 mg total) by mouth every morning. 30 capsule 3   No facility-administered medications prior to visit.     ROS See HPI  Objective:  BP 118/76   Pulse 92   Temp 98.1 F (36.7 C)   Ht 6\' 3"  (1.905 m)   Wt 261 lb (118.4 kg)   SpO2 98%   BMI 32.62 kg/m   BP Readings from Last 3 Encounters:  10/31/16 118/76  08/10/16 130/80  12/29/15 132/82    Wt Readings from Last 3 Encounters:  10/31/16 261 lb (118.4 kg)  08/10/16 263 lb 12 oz (119.6 kg)  12/29/15 263 lb (119.3 kg)    Physical Exam  Constitutional: No distress.  Cardiovascular: Normal rate, regular rhythm and normal heart sounds.   Musculoskeletal: He exhibits tenderness. He exhibits no edema.  Neurological: He is alert.  Vitals reviewed.   Lab Results  Component Value Date   WBC 4.1 08/10/2016   HGB 16.3 08/10/2016   HCT 48.4 08/10/2016   PLT 194.0 08/10/2016   GLUCOSE 108 (H) 08/10/2016   CHOL 223 (H)  08/10/2016   TRIG 66.0 08/10/2016   HDL 55.90 08/10/2016   LDLDIRECT 167.0 10/31/2012   LDLCALC 154 (H) 08/10/2016   ALT 17 08/10/2016   AST 18 08/10/2016   NA 137 08/10/2016   K 3.9 08/10/2016   CL 107 08/10/2016   CREATININE 1.02 08/10/2016   BUN 14 08/10/2016   CO2 25 08/10/2016   TSH 1.74 08/12/2012   PSA 1.17 12/26/2010   HGBA1C 6.0 08/10/2016    Dg Chest 2 View  Result Date: 09/22/2013 CLINICAL DATA:  Preop for right shoulder surgery next week. Smoker without chest complaint. EXAM: CHEST  2 VIEW COMPARISON:  08/20/2012 FINDINGS: Lateral view degraded by patient body habitus and arm position. Midline trachea. Normal heart size and mediastinal contours. No pleural effusion or pneumothorax. Clear lungs. IMPRESSION: No acute cardiopulmonary disease. Electronically Signed   By: Abigail Miyamoto M.D.   On: 09/22/2013 12:05    Assessment & Plan:   Zachry was seen today for follow-up.  Diagnoses and all orders for this visit:  Class 1 obesity due to excess calories without serious comorbidity with body mass index (BMI) of 32.0 to 32.9 in adult -     phentermine 37.5 MG capsule; Take 1 capsule (37.5 mg total) by mouth every morning.  Chronic foot pain, unspecified laterality -     Ibuprofen-Famotidine (DUEXIS) 800-26.6 MG TABS; Take 1 tablet by mouth  every 8 (eight) hours as needed. With food   I have discontinued Mr. Breeze Naproxen Sodium (ALEVE PO). I am also having him start on Ibuprofen-Famotidine. Additionally, I am having him maintain his cyclobenzaprine, sildenafil, and phentermine.  Meds ordered this encounter  Medications  . DISCONTD: Naproxen Sodium (ALEVE PO)    Sig: Take by mouth.  . Ibuprofen-Famotidine (DUEXIS) 800-26.6 MG TABS    Sig: Take 1 tablet by mouth every 8 (eight) hours as needed. With food    Dispense:  30 tablet    Refill:  3    Order Specific Question:   Supervising Provider    Answer:   Cassandria Anger [[id number]]  . phentermine 37.5 MG capsule      Sig: Take 1 capsule (37.5 mg total) by mouth every morning.    Dispense:  30 capsule    Refill:  3    Order Specific Question:   Supervising Provider    Answer:   Cassandria Anger [[id number]]   Follow-up: Return in about 3 months (around 01/31/2017) for weight loss with Dr. Sharlet Salina.  Wilfred Lacy, NP

## 2016-10-31 NOTE — Patient Instructions (Addendum)
Need to bring new form to be completed and signed.  Calorie Counting for Weight Loss Calories are units of energy. Your body needs a certain amount of calories from food to keep you going throughout the day. When you eat more calories than your body needs, your body stores the extra calories as fat. When you eat fewer calories than your body needs, your body burns fat to get the energy it needs. Calorie counting means keeping track of how many calories you eat and drink each day. Calorie counting can be helpful if you need to lose weight. If you make sure to eat fewer calories than your body needs, you should lose weight. Ask your health care provider what a healthy weight is for you. For calorie counting to work, you will need to eat the right number of calories in a day in order to lose a healthy amount of weight per week. A dietitian can help you determine how many calories you need in a day and will give you suggestions on how to reach your calorie goal.  A healthy amount of weight to lose per week is usually 1-2 lb (0.5-0.9 kg). This usually means that your daily calorie intake should be reduced by 500-750 calories.  Eating 1,200 - 1,500 calories per day can help most women lose weight.  Eating 1,500 - 1,800 calories per day can help most men lose weight.  What is my plan? My goal is to have __________ calories per day. If I have this many calories per day, I should lose around __________ pounds per week. What do I need to know about calorie counting? In order to meet your daily calorie goal, you will need to:  Find out how many calories are in each food you would like to eat. Try to do this before you eat.  Decide how much of the food you plan to eat.  Write down what you ate and how many calories it had. Doing this is called keeping a food log.  To successfully lose weight, it is important to balance calorie counting with a healthy lifestyle that includes regular activity. Aim for 150  minutes of moderate exercise (such as walking) or 75 minutes of vigorous exercise (such as running) each week. Where do I find calorie information?  The number of calories in a food can be found on a Nutrition Facts label. If a food does not have a Nutrition Facts label, try to look up the calories online or ask your dietitian for help. Remember that calories are listed per serving. If you choose to have more than one serving of a food, you will have to multiply the calories per serving by the amount of servings you plan to eat. For example, the label on a package of bread might say that a serving size is 1 slice and that there are 90 calories in a serving. If you eat 1 slice, you will have eaten 90 calories. If you eat 2 slices, you will have eaten 180 calories. How do I keep a food log? Immediately after each meal, record the following information in your food log:  What you ate. Don't forget to include toppings, sauces, and other extras on the food.  How much you ate. This can be measured in cups, ounces, or number of items.  How many calories each food and drink had.  The total number of calories in the meal.  Keep your food log near you, such as in a small notebook  in your pocket, or use a mobile app or website. Some programs will calculate calories for you and show you how many calories you have left for the day to meet your goal. What are some calorie counting tips?  Use your calories on foods and drinks that will fill you up and not leave you hungry: ? Some examples of foods that fill you up are nuts and nut butters, vegetables, lean proteins, and high-fiber foods like whole grains. High-fiber foods are foods with more than 5 g fiber per serving. ? Drinks such as sodas, specialty coffee drinks, alcohol, and juices have a lot of calories, yet do not fill you up.  Eat nutritious foods and avoid empty calories. Empty calories are calories you get from foods or beverages that do not have  many vitamins or protein, such as candy, sweets, and soda. It is better to have a nutritious high-calorie food (such as an avocado) than a food with few nutrients (such as a bag of chips).  Know how many calories are in the foods you eat most often. This will help you calculate calorie counts faster.  Pay attention to calories in drinks. Low-calorie drinks include water and unsweetened drinks.  Pay attention to nutrition labels for "low fat" or "fat free" foods. These foods sometimes have the same amount of calories or more calories than the full fat versions. They also often have added sugar, starch, or salt, to make up for flavor that was removed with the fat.  Find a way of tracking calories that works for you. Get creative. Try different apps or programs if writing down calories does not work for you. What are some portion control tips?  Know how many calories are in a serving. This will help you know how many servings of a certain food you can have.  Use a measuring cup to measure serving sizes. You could also try weighing out portions on a kitchen scale. With time, you will be able to estimate serving sizes for some foods.  Take some time to put servings of different foods on your favorite plates, bowls, and cups so you know what a serving looks like.  Try not to eat straight from a bag or box. Doing this can lead to overeating. Put the amount you would like to eat in a cup or on a plate to make sure you are eating the right portion.  Use smaller plates, glasses, and bowls to prevent overeating.  Try not to multitask (for example, watch TV or use your computer) while eating. If it is time to eat, sit down at a table and enjoy your food. This will help you to know when you are full. It will also help you to be aware of what you are eating and how much you are eating. What are tips for following this plan? Reading food labels  Check the calorie count compared to the serving size. The  serving size may be smaller than what you are used to eating.  Check the source of the calories. Make sure the food you are eating is high in vitamins and protein and low in saturated and trans fats. Shopping  Read nutrition labels while you shop. This will help you make healthy decisions before you decide to purchase your food.  Make a grocery list and stick to it. Cooking  Try to cook your favorite foods in a healthier way. For example, try baking instead of frying.  Use low-fat dairy products. Meal planning  Use more fruits and vegetables. Half of your plate should be fruits and vegetables.  Include lean proteins like poultry and fish. How do I count calories when eating out?  Ask for smaller portion sizes.  Consider sharing an entree and sides instead of getting your own entree.  If you get your own entree, eat only half. Ask for a box at the beginning of your meal and put the rest of your entree in it so you are not tempted to eat it.  If calories are listed on the menu, choose the lower calorie options.  Choose dishes that include vegetables, fruits, whole grains, low-fat dairy products, and lean protein.  Choose items that are boiled, broiled, grilled, or steamed. Stay away from items that are buttered, battered, fried, or served with cream sauce. Items labeled "crispy" are usually fried, unless stated otherwise.  Choose water, low-fat milk, unsweetened iced tea, or other drinks without added sugar. If you want an alcoholic beverage, choose a lower calorie option such as a glass of wine or light beer.  Ask for dressings, sauces, and syrups on the side. These are usually high in calories, so you should limit the amount you eat.  If you want a salad, choose a garden salad and ask for grilled meats. Avoid extra toppings like bacon, cheese, or fried items. Ask for the dressing on the side, or ask for olive oil and vinegar or lemon to use as dressing.  Estimate how many  servings of a food you are given. For example, a serving of cooked rice is  cup or about the size of half a baseball. Knowing serving sizes will help you be aware of how much food you are eating at restaurants. The list below tells you how big or small some common portion sizes are based on everyday objects: ? 1 oz-4 stacked dice. ? 3 oz-1 deck of cards. ? 1 tsp-1 die. ? 1 Tbsp- a ping-pong ball. ? 2 Tbsp-1 ping-pong ball. ?  cup- baseball. ? 1 cup-1 baseball. Summary  Calorie counting means keeping track of how many calories you eat and drink each day. If you eat fewer calories than your body needs, you should lose weight.  A healthy amount of weight to lose per week is usually 1-2 lb (0.5-0.9 kg). This usually means reducing your daily calorie intake by 500-750 calories.  The number of calories in a food can be found on a Nutrition Facts label. If a food does not have a Nutrition Facts label, try to look up the calories online or ask your dietitian for help.  Use your calories on foods and drinks that will fill you up, and not on foods and drinks that will leave you hungry.  Use smaller plates, glasses, and bowls to prevent overeating. This information is not intended to replace advice given to you by your health care provider. Make sure you discuss any questions you have with your health care provider. Document Released: 05/07/2005 Document Revised: 04/06/2016 Document Reviewed: 04/06/2016 Elsevier Interactive Patient Education  2017 [employer].   Famotidine; Ibuprofen oral tablets What is this medicine? FAMOTIDINE; IBUPROFEN (fa MOE ti deen; eye BYOO proe fen) is two medicines together. Ibuprofen is a non-steroidal anti-inflammatory drug (NSAID). It is used for arthritis pain. Famotidine is a type of antihistamine that blocks the release of stomach acid. It is used to prevent stomach problems from the ibuprofen. This medicine may be used for other purposes; ask your health care  provider or pharmacist  if you have questions. COMMON BRAND NAME(S): DUEXIS What should I tell my health care provider before I take this medicine? They need to know if you have any of these conditions: -asthma -cigarette smoker -drink more than 3 alcohol containing drinks a day -heart disease -high blood pressure -history of stomach bleeding -kidney disease -liver disease -stomach or intestine problems -an unusual or allergic reaction to famotidine, ibuprofen, other medicines, foods, dyes, or preservatives -pregnant or trying to get pregnant -breast-feeding How should I use this medicine? Take this medicine by mouth with a glass of water. Follow the directions on the prescription label. Do not cut, crush or chew this medicine. You can take it with or without food. If it upsets your stomach, take it with food. Take your medicine at regular intervals. Do not take it more often than directed. Do not stop taking except on your doctor's advice. A special MedGuide will be given to you by the pharmacist with each prescription and refill. Be sure to read this information carefully each time. Talk to your pediatrician regarding the use of this medicine in children. Special care may be needed. Overdosage: If you think you have taken too much of this medicine contact a poison control center or emergency room at once. NOTE: This medicine is only for you. Do not share this medicine with others. What if I miss a dose? If you miss a dose, take it as soon as you can. If it is almost time for your next dose, take only that dose. Do not take double or extra doses. What may interact with this medicine? Do not take this medicine with any of the following medications: -cidofovir -ketorolac -methotrexate This medicine may also interact with the following medications: -alcohol -aspirin -certain medicines for blood pressure, heart disease, irregular heart beat -certain medicines for depression, anxiety, or  psychotic disturbances -cholestyramine -diuretics -lithium -steroid medicines like prednisone or cortisone -supplements like feverfew, flavocoxid, garlic, ginger, ginkgo, and methylsulfonylmethane, MSM -warfarin This list may not describe all possible interactions. Give your health care provider a list of all the medicines, herbs, non-prescription drugs, or dietary supplements you use. Also tell them if you smoke, drink alcohol, or use illegal drugs. Some items may interact with your medicine. What should I watch for while using this medicine? Tell your doctor or healthcare professional if your symptoms do not start to get better or if they get worse. Do not take other medicines that contain aspirin, ibuprofen, or naproxen with this medicine. Side effects such as stomach upset, nausea, or ulcers may be more likely to occur. Many medicines available without a prescription should not be taken with this medicine. This medicine can cause ulcers and bleeding in the stomach and intestines at any time during treatment. This can happen with no warning and may cause death. There is increased risk with taking this medicine for a long time. Smoking, drinking alcohol, older age, and poor health can also increase risks. Call your doctor right away if you have stomach pain or blood in your vomit or stool. This medicine does not prevent heart attack or stroke. In fact, this medicine may increase the chance of a heart attack or stroke. The chance may increase with longer use of this medicine and in people who have heart disease. If you take aspirin to prevent heart attack or stroke, talk with your doctor or health care professional. This medicine may increase your risk to bruise or bleed. Call your doctor or health care professional if  you notice any unusual bleeding. What side effects may I notice from receiving this medicine? Side effects that you should report to your doctor or health care professional as soon as  possible: -allergic reactions like skin rash, itching or hives, swelling of the face, lips, or tongue -severe stomach pain -signs and symptoms of bleeding such as bloody or black, tarry stools; red or dark-brown urine; spitting up blood or brown material that looks like coffee grounds; red spots on the skin; unusual bruising or bleeding from the eye, gums, or nose -signs and symptoms of a blood clot such as changes in vision; chest pain; severe, sudden headache; trouble speaking; sudden numbness or weakness of the face, arm, or leg -unexplained weight gain or swelling -unusually weak or tired -yellowing of the eyes or skin Side effects that usually do not require medical attention (report to your doctor or health care professional if they continue or are bothersome): -constipation -diarrhea -dizziness, drowsiness -headache -nausea, vomiting This list may not describe all possible side effects. Call your doctor for medical advice about side effects. You may report side effects to FDA at 1-800-FDA-1088. Where should I keep my medicine? Keep out of the reach of children. Store at room temperature between 15 and 30 degrees C (59 and 86 degrees F). Throw away any unused medicine after the expiration date. NOTE: This sheet is a summary. It may not cover all possible information. If you have questions about this medicine, talk to your doctor, pharmacist, or health care provider.  2018 Elsevier/Gold Standard (2015-06-09 09:23:08)

## 2016-11-12 ENCOUNTER — Ambulatory Visit: Payer: BC Managed Care – PPO | Admitting: Internal Medicine

## 2016-11-13 ENCOUNTER — Telehealth: Payer: Self-pay | Admitting: Nurse Practitioner

## 2016-11-13 NOTE — Telephone Encounter (Signed)
Form corrected by [place],mail to pt and send copy to scan.

## 2016-11-19 ENCOUNTER — Telehealth: Payer: Self-pay | Admitting: Internal Medicine

## 2016-11-19 NOTE — Telephone Encounter (Signed)
Pt called checking on the form that was being completed for foster care. I see a note from 11/13/2016 stating that the form was corrected and mailed to the pt. Is this the form for foster care?

## 2016-11-22 NOTE — Telephone Encounter (Signed)
Waiting for the scan center to call back, she going to check on it  [phone]--scan center

## 2016-11-22 NOTE — Telephone Encounter (Signed)
im looking into this.

## 2016-11-22 NOTE — Telephone Encounter (Signed)
Spoke with Clifford Newman, she will mail over the copy of the form to my name?

## 2016-11-22 NOTE — Telephone Encounter (Signed)
Inform pt that we mail this out on 11/13/2016. Going to check with medical record for the copy because pt stated he have not got the form we mail.

## 2016-11-27 NOTE — Telephone Encounter (Signed)
Pt received the form via mail.

## 2017-02-27 ENCOUNTER — Other Ambulatory Visit: Payer: Self-pay | Admitting: Nurse Practitioner

## 2017-02-27 DIAGNOSIS — M79673 Pain in unspecified foot: Principal | ICD-10-CM

## 2017-02-27 DIAGNOSIS — G8929 Other chronic pain: Secondary | ICD-10-CM

## 2017-02-28 NOTE — Telephone Encounter (Signed)
LM letting pt know °

## 2017-02-28 NOTE — Telephone Encounter (Signed)
30 day supply sent to pharmacy. Please help call and offer an appt before he runs out.   No refill for phentermine until he see his PCP as well.

## 2017-07-05 ENCOUNTER — Ambulatory Visit: Payer: BC Managed Care – PPO | Admitting: Family Medicine

## 2017-07-08 ENCOUNTER — Ambulatory Visit (INDEPENDENT_AMBULATORY_CARE_PROVIDER_SITE_OTHER): Payer: BC Managed Care – PPO | Admitting: Family Medicine

## 2017-07-08 ENCOUNTER — Telehealth: Payer: Self-pay | Admitting: Internal Medicine

## 2017-07-08 ENCOUNTER — Encounter: Payer: Self-pay | Admitting: Family Medicine

## 2017-07-08 VITALS — BP 128/72 | HR 85 | Temp 97.9°F | Ht 75.0 in | Wt 270.0 lb

## 2017-07-08 DIAGNOSIS — M79644 Pain in right finger(s): Secondary | ICD-10-CM | POA: Insufficient documentation

## 2017-07-08 MED ORDER — DICLOFENAC SODIUM 2 % TD SOLN: 1.0000 "application " | Freq: Two times a day (BID) | TRANSDERMAL | 3 refills | Status: DC

## 2017-07-08 NOTE — Patient Instructions (Addendum)
Take tylenol 650 mg three times a day is the best evidence based medicine we have for arthritis.   Glucosamine sulfate 750mg  twice a day is a supplement that has been shown to help moderate to severe arthritis.  Vitamin D 2000 IU daily  Fish oil 2 grams daily.   Tumeric 500mg  twice daily.   Capsaicin topically up to four times a day may also help with pain.  Cortisone injections are an option if these interventions do not seem to make a difference or need more relief.   Please try ice   Please follow up if your pain becomes severe and I can perform an injection into the joint.   Tart cherry juice can help with inflammation as well.

## 2017-07-08 NOTE — Telephone Encounter (Signed)
Fine

## 2017-07-08 NOTE — Progress Notes (Signed)
Clifford Newman - 57 y.o. male MRN [id number]  Date of birth: 1961/05/19  SUBJECTIVE:  Including CC & ROS.  Chief Complaint  Patient presents with  . Swelling right hand    Clifford Newman is a 58 y.o. male that is presenting with right hand swelling and tenderness. Ongoing for two weeks. Located between his IP joint of the thumb and right index finger. He previously had surgery to repair torn tendon in 1983. 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 He has been taking anything for the pain. Pain increases when he grasps objects. Pain is intermittent in nature. He is right-handed. Pain ranges some mild to moderate and pain is localized at the thumb.  He has been caulking recently and that may have flared the pain. Has not done anything for the pain.    Review of Systems  Constitutional: Negative for fever.  HENT: Negative for sinus pressure.   Respiratory: Negative for cough.   Cardiovascular: Negative for chest pain.  Gastrointestinal: Negative for abdominal pain.  Musculoskeletal: Positive for arthralgias.  Skin: Negative for color change.  Neurological: Negative for weakness and numbness.  Hematological: Negative for adenopathy.  Psychiatric/Behavioral: Negative for agitation.    HISTORY: Past Medical, Surgical, Social, and Family History Reviewed & Updated per EMR.   Pertinent Historical Findings include:  Past Medical History:  Diagnosis Date  . Colon polyp 2001   Dr Fuller Plan  . Hyperlipidemia     Past Surgical History:  Procedure Laterality Date  . COLONOSCOPY  2006 & 2011   negative, Dr Fuller Plan  . COLONOSCOPY W/ POLYPECTOMY  2001  . CYSTECTOMY     Ganglion cyst-left wrist   . HAND SURGERY  1983   Right thumb surgery   . ROTATOR CUFF REPAIR Right   . WISDOM TOOTH EXTRACTION      No Known Allergies  Family History  Problem Relation Age of Onset  . Colon cancer Father 47  . Diabetes Father        IDDM  . Stroke Paternal Grandfather        >55  . Stroke Paternal  Grandmother        > 65  . Heart disease Neg Hx      Social History   Socioeconomic History  . Marital status: Married    Spouse name: Not on file  . Number of children: Not on file  . Years of education: Not on file  . Highest education level: Not on file  Social Needs  . Financial resource strain: Not on file  . Food insecurity - worry: Not on file  . Food insecurity - inability: Not on file  . Transportation needs - medical: Not on file  . Transportation needs - non-medical: Not on file  Occupational History  . Not on file  Tobacco Use  . Smoking status: Current Every Day Smoker    Packs/day: 1.00    Years: 10.00    Pack years: 10.00    Types: Cigarettes    Last attempt to quit: 08/30/2014    Years since quitting: 2.8  . Smokeless tobacco: Current User  . Tobacco comment: see Problem List, e-cig without nicotine  Substance and Sexual Activity  . Alcohol use: Yes    Alcohol/week: 1.2 oz    Types: 2 Shots of liquor per week  . Drug use: No  . Sexual activity: Not on file  Other Topics Concern  . Not on file  Social History Narrative  .  Not on file     PHYSICAL EXAM:  VS: BP 128/72 (BP Location: Left Arm, Patient Position: Sitting, Cuff Size: Normal)   Pulse 85   Temp 97.9 F (36.6 C) (Oral)   Ht 6\' 3"  (1.905 m)   Wt 270 lb (122.5 kg)   SpO2 98%   BMI 33.75 kg/m  Physical Exam Gen: NAD, alert, cooperative with exam, well-appearing ENT: normal lips, normal nasal mucosa,  Eye: normal EOM, normal conjunctiva and lids CV:  no edema, +2 pedal pulses   Resp: no accessory muscle use, non-labored,   Skin: no rashes, no areas of induration  Neuro: normal tone, normal sensation to touch Psych:  normal insight, alert and oriented MSK:  Right-hand: Tenderness to palpation over the MP joint of the thumb. Obvious deformity of the thumb in a radial rotation at baseline Limited flexion and extension of the MP joint. No instability of the MP joint. No tenderness to  palpation of the [facility] joint. Normal wrist range of motion. Limited flexion and extension of the thumb. Neurovascularly intact.  Limited ultrasound: Right thumb:  Normal-appearing CMC joint. Significant degenerative changes of the MP joint. 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 Has a history of trauma which likely has led to the degeneration of that joint. - Pennsaid  - counseled on icing  - he will follow in order to get an injection - could try a thumb spica splint for rest if flares.

## 2017-07-08 NOTE — Telephone Encounter (Signed)
Patient came into office today. Patient is requesting to transfer from [facility] - Dr. Sharlet Salina to [facility] - Dr. Ethelene Hal. Please advise of transfer.

## 2017-07-08 NOTE — Telephone Encounter (Signed)
Ok for patient to transfer?  

## 2017-07-08 NOTE — Assessment & Plan Note (Signed)
Pain is likely secondary to the advanced degenerative changes of the MP joint. Has a history of trauma which likely has led to the degeneration of that joint. - Pennsaid  - counseled on icing  - he will follow in order to get an injection - could try a thumb spica splint for rest if flares.

## 2017-07-09 NOTE — Telephone Encounter (Signed)
Left message for patient to call office and schedule appointment at [facility] - Dr. Ethelene Hal. Transfer approved

## 2017-08-12 ENCOUNTER — Ambulatory Visit (INDEPENDENT_AMBULATORY_CARE_PROVIDER_SITE_OTHER): Payer: BC Managed Care – PPO | Admitting: Family Medicine

## 2017-08-12 ENCOUNTER — Encounter: Payer: BC Managed Care – PPO | Admitting: Internal Medicine

## 2017-08-12 ENCOUNTER — Encounter: Payer: Self-pay | Admitting: Family Medicine

## 2017-08-12 VITALS — BP 128/80 | HR 96 | Ht 75.0 in | Wt 267.5 lb

## 2017-08-12 DIAGNOSIS — K5901 Slow transit constipation: Secondary | ICD-10-CM | POA: Diagnosis not present

## 2017-08-12 DIAGNOSIS — E782 Mixed hyperlipidemia: Secondary | ICD-10-CM | POA: Diagnosis not present

## 2017-08-12 DIAGNOSIS — Z0000 Encounter for general adult medical examination without abnormal findings: Secondary | ICD-10-CM | POA: Diagnosis not present

## 2017-08-12 DIAGNOSIS — Z720 Tobacco use: Secondary | ICD-10-CM

## 2017-08-12 DIAGNOSIS — J301 Allergic rhinitis due to pollen: Secondary | ICD-10-CM

## 2017-08-12 LAB — COMPREHENSIVE METABOLIC PANEL
ALT: 15 U/L (ref 0–53)
AST: 14 U/L (ref 0–37)
Albumin: 3.9 g/dL (ref 3.5–5.2)
Alkaline Phosphatase: 44 U/L (ref 39–117)
BUN: 12 mg/dL (ref 6–23)
CO2: 26 mEq/L (ref 19–32)
Calcium: 9.3 mg/dL (ref 8.4–10.5)
Chloride: 105 mEq/L (ref 96–112)
Creatinine, Ser: 0.88 mg/dL (ref 0.40–1.50)
GFR: 114.04 mL/min (ref >60.00)
Glucose, Bld: 119 mg/dL — ABNORMAL HIGH (ref 70–99)
Potassium: 4 mEq/L (ref 3.5–5.1)
Sodium: 139 mEq/L (ref 135–145)
Total Bilirubin: 0.6 mg/dL (ref 0.2–1.2)
Total Protein: 7.1 g/dL (ref 6.0–8.3)

## 2017-08-12 LAB — URINALYSIS, ROUTINE W REFLEX MICROSCOPIC
Bilirubin Urine: NEGATIVE
Hgb urine dipstick: NEGATIVE
Ketones, ur: NEGATIVE
Leukocytes, UA: NEGATIVE
Nitrite: NEGATIVE
RBC / HPF: NONE SEEN (ref 0–?)
Specific Gravity, Urine: 1.02 (ref 1.000–1.030)
Total Protein, Urine: NEGATIVE
Urine Glucose: NEGATIVE
Urobilinogen, UA: 0.2 (ref 0.0–1.0)
pH: 6.5 (ref 5.0–8.0)

## 2017-08-12 LAB — LIPID PANEL
Cholesterol: 232 mg/dL — ABNORMAL HIGH (ref 0–200)
HDL: 55.8 mg/dL (ref >39.00)
LDL Cholesterol: 160 mg/dL — ABNORMAL HIGH (ref 0–99)
NonHDL: 176.65
Total CHOL/HDL Ratio: 4
Triglycerides: 81 mg/dL (ref 0.0–149.0)
VLDL: 16.2 mg/dL (ref 0.0–40.0)

## 2017-08-12 LAB — CBC
HCT: 45.6 % (ref 39.0–52.0)
Hemoglobin: 15.3 g/dL (ref 13.0–17.0)
MCHC: 33.6 g/dL (ref 30.0–36.0)
MCV: 91 fl (ref 78.0–100.0)
Platelets: 191 10*3/uL (ref 150.0–400.0)
RBC: 5 Mil/uL (ref 4.22–5.81)
RDW: 13.9 % (ref 11.5–15.5)
WBC: 3.3 10*3/uL — ABNORMAL LOW (ref 4.0–10.5)

## 2017-08-12 LAB — TSH: TSH: 1.85 u[IU]/mL (ref 0.35–4.50)

## 2017-08-12 LAB — PSA: PSA: 2.17 ng/mL (ref 0.10–4.00)

## 2017-08-12 MED ORDER — SILDENAFIL CITRATE 100 MG PO TABS: 50.0000 mg | ORAL_TABLET | Freq: Every day | ORAL | 5 refills | Status: DC | PRN

## 2017-08-12 MED ORDER — FLUTICASONE PROPIONATE 50 MCG/ACT NA SUSP: 2.0000 | Freq: Every day | NASAL | 6 refills | Status: DC

## 2017-08-12 NOTE — Addendum Note (Signed)
Addended by: Kateri Mc E on: 08/12/2017 11:10 AM   Modules accepted: Orders

## 2017-08-12 NOTE — Progress Notes (Signed)
Subjective:  Patient ID: Clifford Newman, male    DOB: [birth year]  Age: 57 y.o. MRN: [id number]  CC: Annual Exam   HPI Clifford Newman presents for a physical exam and for establishment of care.  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  He is married with older children the youngest of which will be starting college this fall hopefully with basketball Monday.  Patient has a past medical history of elevated cholesterol, 30-pack-year history, allergy rhinitis and more recently constipation with hard stools.  He will be due for colonoscopy in 2020.  He started screening for colon cancer in his 13s because his father developed colon cancer in his late 78s.  Patient's father passed from injuries sustained in an MVA at age 49.  Patient's mother is 43 and still lives independently and drives herself to church.  Patient believes that he probably has a 30-pack-year history.  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  History [place] has a past medical history of Colon polyp (2001) and Hyperlipidemia.   He has a past surgical history that includes Cystectomy; Hand surgery (1983); Colonoscopy (2006 & 2011); Colonoscopy w/ polypectomy (2001); Wisdom tooth extraction; and Rotator cuff repair (Right).   His family history includes Colon cancer (age of onset: 33) in his father; Diabetes in his father; Stroke in his paternal grandfather and paternal grandmother.He reports that he has been smoking cigarettes.  He has a 10.00 pack-year smoking history. He uses smokeless tobacco. He reports that he drinks about 1.2 oz of alcohol per week. He reports that he does not use drugs.  Outpatient Medications Prior to Visit  Medication Sig Dispense Refill  . sildenafil (VIAGRA) 100 MG tablet Take 0.5-1 tablets (50-100 mg total) by mouth daily as needed for  erectile dysfunction. 10 tablet 11  . Diclofenac Sodium (PENNSAID) 2 % SOLN Place 1 application onto the skin 2 (two) times daily. 1 Bottle 3   No facility-administered medications prior to visit.     ROS Review of Systems  Constitutional: Negative for chills, fatigue and fever.  HENT: Positive for congestion, postnasal drip, rhinorrhea and sneezing.   Eyes: Negative for photophobia and visual disturbance.  Respiratory: Positive for cough and shortness of breath. Negative for chest tightness and wheezing.   Cardiovascular: Negative.   Gastrointestinal: Positive for constipation. Negative for abdominal pain, anal bleeding and blood in stool.  Endocrine: Negative for cold intolerance, heat intolerance, polyphagia and polyuria.  Genitourinary: Negative for difficulty urinating, hematuria and urgency.  Musculoskeletal: Negative for gait problem and myalgias.  Skin: Negative for pallor and rash.  Allergic/Immunologic: Negative for immunocompromised state.  Neurological: Negative for weakness and headaches.  Hematological: Does not bruise/bleed easily.  Psychiatric/Behavioral: Negative.     Objective:  BP 128/80 (BP Location: Left Arm, Patient Position: Sitting, Cuff Size: Normal)   Pulse 96   Ht 6\' 3"  (1.905 m)   Wt 267 lb 8 oz (121.3 kg)   SpO2 95%   BMI 33.44 kg/m   Physical Exam  Constitutional: He is oriented to person, place, and time. He appears well-developed and well-nourished. No distress.  HENT:  Head: Normocephalic and atraumatic.  Right Ear: External ear normal.  Left Ear: External ear normal.  Mouth/Throat: Oropharynx is clear and moist. No oropharyngeal exudate.  Eyes: Pupils are equal, round, and  reactive to light. Conjunctivae are normal. Right eye exhibits no discharge. Left eye exhibits no discharge. No scleral icterus.  Neck: Neck supple. No JVD present. No tracheal deviation present. No thyromegaly present.  Cardiovascular: Normal rate, regular rhythm and  normal heart sounds.  Pulmonary/Chest: Effort normal. He has decreased breath sounds. He has no wheezes. He has no rhonchi. He has no rales.  Abdominal: Soft. Bowel sounds are normal. He exhibits no distension. There is no tenderness. There is no rebound and no guarding.  Genitourinary: Rectal exam shows no external hemorrhoid, no internal hemorrhoid, no fissure, no mass, no tenderness, anal tone normal and guaiac negative stool. Prostate is enlarged. Prostate is not tender.  Lymphadenopathy:    He has no cervical adenopathy.  Neurological: He is alert and oriented to person, place, and time.  Skin: Skin is warm and dry. He is not diaphoretic.  Psychiatric: He has a normal mood and affect. His behavior is normal.      Assessment & Plan:   Clifford Newman was seen today for annual exam.  Diagnoses and all orders for this visit:  Mixed hyperlipidemia -     Comprehensive metabolic panel -     Lipid panel -     TSH  Tobacco abuse -     Ambulatory Referral for Lung Cancer Scre  Routine general medical examination at a health care facility -     CBC -     Comprehensive metabolic panel -     HIV antibody -     PSA -     TSH -     Urinalysis, Routine w reflex microscopic  Allergic rhinitis due to pollen, unspecified seasonality -     fluticasone (FLONASE) 50 MCG/ACT nasal spray; Place 2 sprays into both nostrils daily.  Slow transit constipation -     TSH -     Urinalysis, Routine w reflex microscopic   I have discontinued Clifford Newman's Diclofenac Sodium. I am also having him start on fluticasone. Additionally, I am having him maintain his sildenafil.  Meds ordered this encounter  Medications  . fluticasone (FLONASE) 50 MCG/ACT nasal spray    Sig: Place 2 sprays into both nostrils daily.    Dispense:  16 g    Refill:  6   Patient does admit to some shortness of breath walking up steps is so poor.  Told him about my concern that he may be developing early COPD.  Asked him to  stop.  He was given information along these lines.  Hopefully will qualify for the low-dose CT of his chest.  He has an ongoing history of constipation he uses milk of magnesia.  Asked him to try Colace and FiberCon.  He knows about the importance of hydrating properly.  He has not been exercising and I asked him to get back to the gym and he said that he is on his list of things to do.  Follow-up: Return in about 3 months (around 11/12/2017).  Libby Maw, MD

## 2017-08-12 NOTE — Patient Instructions (Signed)
Allergic Rhinitis, Adult Allergic rhinitis is an allergic reaction that affects the mucous membrane inside the nose. It causes sneezing, a runny or stuffy nose, and the feeling of mucus going down the back of the throat (postnasal drip). Allergic rhinitis can be mild to severe. There are two types of allergic rhinitis:  Seasonal. This type is also called hay fever. It happens only during certain seasons.  Perennial. This type can happen at any time of the year.  What are the causes? This condition happens when the body's defense system (immune system) responds to certain harmless substances called allergens as though they were germs.  Seasonal allergic rhinitis is triggered by pollen, which can come from grasses, trees, and weeds. Perennial allergic rhinitis may be caused by:  House dust mites.  Pet dander.  Mold spores.  What are the signs or symptoms? Symptoms of this condition include:  Sneezing.  Runny or stuffy nose (nasal congestion).  Postnasal drip.  Itchy nose.  Tearing of the eyes.  Trouble sleeping.  Daytime sleepiness.  How is this diagnosed? This condition may be diagnosed based on:  Your medical history.  A physical exam.  Tests to check for related conditions, such as: ? Asthma. ? Pink eye. ? Ear infection. ? Upper respiratory infection.  Tests to find out which allergens trigger your symptoms. These may include skin or blood tests.  How is this treated? There is no cure for this condition, but treatment can help control symptoms. Treatment may include:  Taking medicines that block allergy symptoms, such as antihistamines. Medicine may be given as a shot, nasal spray, or pill.  Avoiding the allergen.  Desensitization. This treatment involves getting ongoing shots until your body becomes less sensitive to the allergen. This treatment may be done if other treatments do not help.  If taking medicine and avoiding the allergen does not work, new,  stronger medicines may be prescribed.  Follow these instructions at home:  Find out what you are allergic to. Common allergens include smoke, dust, and pollen.  Avoid the things you are allergic to. These are some things you can do to help avoid allergens: ? Replace carpet with wood, tile, or vinyl flooring. Carpet can trap dander and dust. ? Do not smoke. Do not allow smoking in your home. ? Change your heating and air conditioning filter at least once a month. ? During allergy season:  Keep windows closed as much as possible.  Plan outdoor activities when pollen counts are lowest. This is usually during the evening hours.  When coming indoors, change clothing and shower before sitting on furniture or bedding.  Take over-the-counter and prescription medicines only as told by your health care provider.  Keep all follow-up visits as told by your health care provider. This is important. Contact a health care provider if:  You have a fever.  You develop a persistent cough.  You make whistling sounds when you breathe (you wheeze).  Your symptoms interfere with your normal daily activities. Get help right away if:  You have shortness of breath. Summary  This condition can be managed by taking medicines as directed and avoiding allergens.  Contact your health care provider if you develop a persistent cough or fever.  During allergy season, keep windows closed as much as possible. This information is not intended to replace advice given to you by your health care provider. Make sure you discuss any questions you have with your health care provider. Document Released: 01/30/2001 Document Revised: 06/14/2016  Document Reviewed: 06/14/2016 Elsevier Interactive Patient Education  [employer].  Constipation, Adult Constipation is when a person has fewer bowel movements in a week than normal, has difficulty having a bowel movement, or has stools that are dry, hard, or larger  than normal. Constipation may be caused by an underlying condition. It may become worse with age if a person takes certain medicines and does not take in enough fluids. Follow these instructions at home: Eating and drinking   Eat foods that have a lot of fiber, such as fresh fruits and vegetables, whole grains, and beans.  Limit foods that are high in fat, low in fiber, or overly processed, such as french fries, hamburgers, cookies, candies, and soda.  Drink enough fluid to keep your urine clear or pale yellow. General instructions  Exercise regularly or as told by your health care provider.  Go to the restroom when you have the urge to go. Do not hold it in.  Take over-the-counter and prescription medicines only as told by your health care provider. These include any fiber supplements.  Practice pelvic floor retraining exercises, such as deep breathing while relaxing the lower abdomen and pelvic floor relaxation during bowel movements.  Watch your condition for any changes.  Keep all follow-up visits as told by your health care provider. This is important. Contact a health care provider if:  You have pain that gets worse.  You have a fever.  You do not have a bowel movement after 4 days.  You vomit.  You are not hungry.  You lose weight.  You are bleeding from the anus.  You have thin, pencil-like stools. Get help right away if:  You have a fever and your symptoms suddenly get worse.  You leak stool or have blood in your stool.  Your abdomen is bloated.  You have severe pain in your abdomen.  You feel dizzy or you faint. This information is not intended to replace advice given to you by your health care provider. Make sure you discuss any questions you have with your health care provider. Document Released: 02/03/2004 Document Revised: 11/25/2015 Document Reviewed: 10/26/2015 Elsevier Interactive Patient Education  2018 [place] Maintenance,  Male A healthy lifestyle and preventive care is important for your health and wellness. Ask your health care provider about what schedule of regular examinations is right for you. What should I know about weight and diet? Eat a Healthy Diet  Eat plenty of vegetables, fruits, whole grains, low-fat dairy products, and lean protein.  Do not eat a lot of foods high in solid fats, added sugars, or salt.  Maintain a Healthy Weight Regular exercise can help you achieve or maintain a healthy weight. You should:  Do at least 150 minutes of exercise each week. The exercise should increase your heart rate and make you sweat (moderate-intensity exercise).  Do strength-training exercises at least twice a week.  Watch Your Levels of Cholesterol and Blood Lipids  Have your blood tested for lipids and cholesterol every 5 years starting at 59 years of age. If you are at high risk for heart disease, you should start having your blood tested when you are 59 years old. You may need to have your cholesterol levels checked more often if: ? Your lipid or cholesterol levels are high. ? You are older than 59 years of age. ? You are at high risk for heart disease.  What should I know about cancer screening? Many types of cancers can  be detected early and may often be prevented. Lung Cancer  You should be screened every year for lung cancer if: ? You are a current smoker who has smoked for at least 30 years. ? You are a former smoker who has quit within the past 15 years.  Talk to your health care provider about your screening options, when you should start screening, and how often you should be screened.  Colorectal Cancer  Routine colorectal cancer screening usually begins at 59 years of age and should be repeated every 5-10 years until you are 59 years old. You may need to be screened more often if early forms of precancerous polyps or small growths are found. Your health care provider may recommend  screening at an earlier age if you have risk factors for colon cancer.  Your health care provider may recommend using home test kits to check for hidden blood in the stool.  A small camera at the end of a tube can be used to examine your colon (sigmoidoscopy or colonoscopy). This checks for the earliest forms of colorectal cancer.  Prostate and Testicular Cancer  Depending on your age and overall health, your health care provider may do certain tests to screen for prostate and testicular cancer.  Talk to your health care provider about any symptoms or concerns you have about testicular or prostate cancer.  Skin Cancer  Check your skin from head to toe regularly.  Tell your health care provider about any new moles or changes in moles, especially if: ? There is a change in a mole's size, shape, or color. ? You have a mole that is larger than a pencil eraser.  Always use sunscreen. Apply sunscreen liberally and repeat throughout the day.  Protect yourself by wearing long sleeves, pants, a wide-brimmed hat, and sunglasses when outside.  What should I know about heart disease, diabetes, and high blood pressure?  If you are 89-62 years of age, have your blood pressure checked every 3-5 years. If you are 22 years of age or older, have your blood pressure checked every year. You should have your blood pressure measured twice-once when you are at a hospital or clinic, and once when you are not at a hospital or clinic. Record the average of the two measurements. To check your blood pressure when you are not at a hospital or clinic, you can use: ? An automated blood pressure machine at a pharmacy. ? A home blood pressure monitor.  Talk to your health care provider about your target blood pressure.  If you are between 32-29 years old, ask your health care provider if you should take aspirin to prevent heart disease.  Have regular diabetes screenings by checking your fasting blood sugar  level. ? If you are at a normal weight and have a low risk for diabetes, have this test once every three years after the age of 69. ? If you are overweight and have a high risk for diabetes, consider being tested at a younger age or more often.  A one-time screening for abdominal aortic aneurysm (AAA) by ultrasound is recommended for men aged 35-75 years who are current or former smokers. What should I know about preventing infection? Hepatitis B If you have a higher risk for hepatitis B, you should be screened for this virus. Talk with your health care provider to find out if you are at risk for hepatitis B infection. Hepatitis C Blood testing is recommended for:  Everyone born from [birth year] through  [place] with known risk factors for hepatitis C.  Sexually Transmitted Diseases (STDs)  You should be screened each year for STDs including gonorrhea and chlamydia if: ? You are sexually active and are younger than 59 years of age. ? You are older than 59 years of age and your health care provider tells you that you are at risk for this type of infection. ? Your sexual activity has changed since you were last screened and you are at an increased risk for chlamydia or gonorrhea. Ask your health care provider if you are at risk.  Talk with your health care provider about whether you are at high risk of being infected with HIV. Your health care provider may recommend a prescription medicine to help prevent HIV infection.  What else can I do?  Schedule regular health, dental, and eye exams.  Stay current with your vaccines (immunizations).  Do not use any tobacco products, such as cigarettes, chewing tobacco, and e-cigarettes. If you need help quitting, ask your health care provider.  Limit alcohol intake to no more than 2 drinks per day. One drink equals 12 ounces of beer, 5 ounces of wine, or 1 ounces of hard liquor.  Do not use street drugs.  Do not share needles.  Ask your health  care provider for help if you need support or information about quitting drugs.  Tell your health care provider if you often feel depressed.  Tell your health care provider if you have ever been abused or do not feel safe at home. This information is not intended to replace advice given to you by your health care provider. Make sure you discuss any questions you have with your health care provider. Document Released: 11/03/2007 Document Revised: 01/04/2016 Document Reviewed: 02/08/2015 Elsevier Interactive Patient Education  2018 [place] Years, Male Preventive care refers to lifestyle choices and visits with your health care provider that can promote health and wellness. What does preventive care include?  A yearly physical exam. This is also called an annual well check.  Dental exams once or twice a year.  Routine eye exams. Ask your health care provider how often you should have your eyes checked.  Personal lifestyle choices, including: ? Daily care of your teeth and gums. ? Regular physical activity. ? Eating a healthy diet. ? Avoiding tobacco and drug use. ? Limiting alcohol use. ? Practicing safe sex. ? Taking low-dose aspirin every day starting at age 70. What happens during an annual well check? The services and screenings done by your health care provider during your annual well check will depend on your age, overall health, lifestyle risk factors, and family history of disease. Counseling Your health care provider may ask you questions about your:  Alcohol use.  Tobacco use.  Drug use.  Emotional well-being.  Home and relationship well-being.  Sexual activity.  Eating habits.  Work and work Statistician.  Screening You may have the following tests or measurements:  Height, weight, and BMI.  Blood pressure.  Lipid and cholesterol levels. These may be checked every 5 years, or more frequently if you are over 63 years  old.  Skin check.  Lung cancer screening. You may have this screening every year starting at age 68 if you have a 30-pack-year history of smoking and currently smoke or have quit within the past 15 years.  Fecal occult blood test (FOBT) of the stool. You may have this test every year starting at age 38.  Flexible sigmoidoscopy or colonoscopy. You may have a sigmoidoscopy every 5 years or a colonoscopy every 10 years starting at age 60.  Prostate cancer screening. Recommendations will vary depending on your family history and other risks.  Hepatitis C blood test.  Hepatitis B blood test.  Sexually transmitted disease (STD) testing.  Diabetes screening. This is done by checking your blood sugar (glucose) after you have not eaten for a while (fasting). You may have this done every 1-3 years.  Discuss your test results, treatment options, and if necessary, the need for more tests with your health care provider. Vaccines Your health care provider may recommend certain vaccines, such as:  Influenza vaccine. This is recommended every year.  Tetanus, diphtheria, and acellular pertussis (Tdap, Td) vaccine. You may need a Td booster every 10 years.  Varicella vaccine. You may need this if you have not been vaccinated.  Zoster vaccine. You may need this after age 12.  Measles, mumps, and rubella (MMR) vaccine. You may need at least one dose of MMR if you were born in 1957 or later. You may also need a second dose.  Pneumococcal 13-valent conjugate (PCV13) vaccine. You may need this if you have certain conditions and have not been vaccinated.  Pneumococcal polysaccharide (PPSV23) vaccine. You may need one or two doses if you smoke cigarettes or if you have certain conditions.  Meningococcal vaccine. You may need this if you have certain conditions.  Hepatitis A vaccine. You may need this if you have certain conditions or if you travel or work in places where you may be exposed to  hepatitis A.  Hepatitis B vaccine. You may need this if you have certain conditions or if you travel or work in places where you may be exposed to hepatitis B.  Haemophilus influenzae type b (Hib) vaccine. You may need this if you have certain risk factors.  Talk to your health care provider about which screenings and vaccines you need and how often you need them. This information is not intended to replace advice given to you by your health care provider. Make sure you discuss any questions you have with your health care provider. Document Released: 06/03/2015 Document Revised: 01/25/2016 Document Reviewed: 03/08/2015 Elsevier Interactive Patient Education  2018 [place] with Quitting Smoking Quitting smoking is a physical and mental challenge. You will face cravings, withdrawal symptoms, and temptation. Before quitting, work with your health care provider to make a plan that can help you cope. Preparation can help you quit and keep you from giving in. How can I cope with cravings? Cravings usually last for 5-10 minutes. If you get through it, the craving will pass. Consider taking the following actions to help you cope with cravings:  Keep your mouth busy: ? Chew sugar-free gum. ? Suck on hard candies or a straw. ? Brush your teeth.  Keep your hands and body busy: ? Immediately change to a different activity when you feel a craving. ? Squeeze or play with a ball. ? Do an activity or a hobby, like making bead jewelry, practicing needlepoint, or working with wood. ? Mix up your normal routine. ? Take a short exercise break. Go for a quick walk or run up and down stairs. ? Spend time in public places where smoking is not allowed.  Focus on doing something kind or helpful for someone else.  Call a friend or family member to talk during a craving.  Join a support group.  Call a quit  line, such as 1-800-QUIT-NOW.  Talk with your health care provider about medicines that  might help you cope with cravings and make quitting easier for you.  How can I deal with withdrawal symptoms? Your body may experience negative effects as it tries to get used to not having nicotine in the system. These effects are called withdrawal symptoms. They may include:  Feeling hungrier than normal.  Trouble concentrating.  Irritability.  Trouble sleeping.  Feeling depressed.  Restlessness and agitation.  Craving a cigarette.  To manage withdrawal symptoms:  Avoid places, people, and activities that trigger your cravings.  Remember why you want to quit.  Get plenty of sleep.  Avoid coffee and other caffeinated drinks. These may worsen some of your symptoms.  How can I handle social situations? Social situations can be difficult when you are quitting smoking, especially in the first few weeks. To manage this, you can:  Avoid parties, bars, and other social situations where people might be smoking.  Avoid alcohol.  Leave right away if you have the urge to smoke.  Explain to your family and friends that you are quitting smoking. Ask for understanding and support.  Plan activities with friends or family where smoking is not an option.  What are some ways I can cope with stress? Wanting to smoke may cause stress, and stress can make you want to smoke. Find ways to manage your stress. Relaxation techniques can help. For example:  Breathe slowly and deeply, in through your nose and out through your mouth.  Listen to soothing, relaxing music.  Talk with a family member or friend about your stress.  Light a candle.  Soak in a bath or take a shower.  Think about a peaceful place.  What are some ways I can prevent weight gain? Be aware that many people gain weight after they quit smoking. However, not everyone does. To keep from gaining weight, have a plan in place before you quit and stick to the plan after you quit. Your plan should include:  Having healthy  snacks. When you have a craving, it may help to: ? Eat plain popcorn, crunchy carrots, celery, or other cut vegetables. ? Chew sugar-free gum.  Changing how you eat: ? Eat small portion sizes at meals. ? Eat 4-6 small meals throughout the day instead of 1-2 large meals a day. ? Be mindful when you eat. Do not watch television or do other things that might distract you as you eat.  Exercising regularly: ? Make time to exercise each day. If you do not have time for a long workout, do short bouts of exercise for 5-10 minutes several times a day. ? Do some form of strengthening exercise, like weight lifting, and some form of aerobic exercise, like running or swimming.  Drinking plenty of water or other low-calorie or no-calorie drinks. Drink 6-8 glasses of water daily, or as much as instructed by your health care provider.  Summary  Quitting smoking is a physical and mental challenge. You will face cravings, withdrawal symptoms, and temptation to smoke again. Preparation can help you as you go through these challenges.  You can cope with cravings by keeping your mouth busy (such as by chewing gum), keeping your body and hands busy, and making calls to family, friends, or a helpline for people who want to quit smoking.  You can cope with withdrawal symptoms by avoiding places where people smoke, avoiding drinks with caffeine, and getting plenty of rest.  Ask your  health care provider about the different ways to prevent weight gain, avoid stress, and handle social situations. This information is not intended to replace advice given to you by your health care provider. Make sure you discuss any questions you have with your health care provider. Document Released: 05/04/2016 Document Revised: 05/04/2016 Document Reviewed: 05/04/2016 Elsevier Interactive Patient Education  2018 [employer].  Steps to Quit Smoking Smoking tobacco can be bad for your health. It can also affect almost every organ  in your body. Smoking puts you and people around you at risk for many serious long-lasting (chronic) diseases. Quitting smoking is hard, but it is one of the best things that you can do for your health. It is never too late to quit. What are the benefits of quitting smoking? When you quit smoking, you lower your risk for getting serious diseases and conditions. They can include:  Lung cancer or lung disease.  Heart disease.  Stroke.  Heart attack.  Not being able to have children (infertility).  Weak bones (osteoporosis) and broken bones (fractures).  If you have coughing, wheezing, and shortness of breath, those symptoms may get better when you quit. You may also get sick less often. If you are pregnant, quitting smoking can help to lower your chances of having a baby of low birth weight. What can I do to help me quit smoking? Talk with your doctor about what can help you quit smoking. Some things you can do (strategies) include:  Quitting smoking totally, instead of slowly cutting back how much you smoke over a period of time.  Going to in-person counseling. You are more likely to quit if you go to many counseling sessions.  Using resources and support systems, such as: ? Database administrator with a Social worker. ? Phone quitlines. ? Careers information officer. ? Support groups or group counseling. ? Text messaging programs. ? Mobile phone apps or applications.  Taking medicines. Some of these medicines may have nicotine in them. If you are pregnant or breastfeeding, do not take any medicines to quit smoking unless your doctor says it is okay. Talk with your doctor about counseling or other things that can help you.  Talk with your doctor about using more than one strategy at the same time, such as taking medicines while you are also going to in-person counseling. This can help make quitting easier. What things can I do to make it easier to quit? Quitting smoking might feel very hard at  first, but there is a lot that you can do to make it easier. Take these steps:  Talk to your family and friends. Ask them to support and encourage you.  Call phone quitlines, reach out to support groups, or work with a Social worker.  Ask people who smoke to not smoke around you.  Avoid places that make you want (trigger) to smoke, such as: ? Bars. ? Parties. ? Smoke-break areas at work.  Spend time with people who do not smoke.  Lower the stress in your life. Stress can make you want to smoke. Try these things to help your stress: ? Getting regular exercise. ? Deep-breathing exercises. ? Yoga. ? Meditating. ? Doing a body scan. To do this, close your eyes, focus on one area of your body at a time from head to toe, and notice which parts of your body are tense. Try to relax the muscles in those areas.  Download or buy apps on your mobile phone or tablet that can help you stick  to your quit plan. There are many free apps, such as QuitGuide from the [employer] Office manager for Disease Control and Prevention). You can find more support from smokefree.gov and other websites.  This information is not intended to replace advice given to you by your health care provider. Make sure you discuss any questions you have with your health care provider. Document Released: 03/03/2009 Document Revised: 01/03/2016 Document Reviewed: 09/21/2014 Elsevier Interactive Patient Education  2018 [employer].

## 2017-08-13 LAB — HIV ANTIBODY (ROUTINE TESTING W REFLEX): HIV 1&2 Ab, 4th Generation: NONREACTIVE

## 2017-08-14 ENCOUNTER — Other Ambulatory Visit: Payer: Self-pay | Admitting: Acute Care

## 2017-08-14 DIAGNOSIS — Z122 Encounter for screening for malignant neoplasm of respiratory organs: Secondary | ICD-10-CM

## 2017-08-14 DIAGNOSIS — F17210 Nicotine dependence, cigarettes, uncomplicated: Secondary | ICD-10-CM

## 2017-08-26 ENCOUNTER — Telehealth: Payer: Self-pay | Admitting: Acute Care

## 2017-08-27 NOTE — Telephone Encounter (Signed)
LMTC x 1  

## 2017-08-27 NOTE — Telephone Encounter (Signed)
Spoke with pt and rescheduled [facility] 09/02/17 2:30.  Ct will be rescheduled.

## 2017-08-28 ENCOUNTER — Encounter: Payer: BC Managed Care – PPO | Admitting: Acute Care

## 2017-08-28 ENCOUNTER — Inpatient Hospital Stay: Admission: RE | Admit: 2017-08-28 | Payer: BC Managed Care – PPO | Source: Ambulatory Visit

## 2017-09-02 ENCOUNTER — Ambulatory Visit (INDEPENDENT_AMBULATORY_CARE_PROVIDER_SITE_OTHER): Payer: BC Managed Care – PPO | Admitting: Acute Care

## 2017-09-02 ENCOUNTER — Encounter: Payer: Self-pay | Admitting: Acute Care

## 2017-09-02 ENCOUNTER — Ambulatory Visit (INDEPENDENT_AMBULATORY_CARE_PROVIDER_SITE_OTHER)
Admission: RE | Admit: 2017-09-02 | Discharge: 2017-09-02 | Disposition: A | Discharge status: Home / Self Care | Payer: BC Managed Care – PPO | Source: Ambulatory Visit | Attending: Acute Care | Admitting: Acute Care

## 2017-09-02 DIAGNOSIS — F17210 Nicotine dependence, cigarettes, uncomplicated: Secondary | ICD-10-CM | POA: Diagnosis not present

## 2017-09-02 DIAGNOSIS — Z122 Encounter for screening for malignant neoplasm of respiratory organs: Secondary | ICD-10-CM

## 2017-09-02 NOTE — Progress Notes (Signed)
Shared Decision Making Visit Lung Cancer Screening Program [phone])   Eligibility:  Age 57 y.o.  Pack Years Smoking History Calculation 55 pack year smoking history (# packs/per year x # years smoked)  Recent History of coughing up blood  no  Unexplained weight loss? no ( >Than 15 pounds within the last 6 months )  Prior History Lung / other cancer no (Diagnosis within the last 5 years already requiring surveillance chest CT Scans).  Smoking Status Current Smoker  Former Smokers: Years since quit: NA  Quit Date: NA  Visit Components:  Discussion included one or more decision making aids. yes  Discussion included risk/benefits of screening. yes  Discussion included potential follow up diagnostic testing for abnormal scans. yes  Discussion included meaning and risk of over diagnosis. yes  Discussion included meaning and risk of False Positives. yes  Discussion included meaning of total radiation exposure. yes  Counseling Included:  Importance of adherence to annual lung cancer LDCT screening. yes  Impact of comorbidities on ability to participate in the program. yes  Ability and willingness to under diagnostic treatment. yes  Smoking Cessation Counseling:  Current Smokers:   Discussed importance of smoking cessation. yes  Information about tobacco cessation classes and interventions provided to patient. yes  Patient provided with "ticket" for LDCT Scan. yes  Symptomatic Patient. no  CounselingNA  Diagnosis Code: Tobacco Use Z72.0  Asymptomatic Patient yes  Counseling (Intermediate counseling: > three minutes counseling) D2202  Former Smokers:   Discussed the importance of maintaining cigarette abstinence. yes  Diagnosis Code: Personal History of Nicotine Dependence. R42.706  Information about tobacco cessation classes and interventions provided to patient. Yes  Patient provided with "ticket" for LDCT Scan. yes  Written Order for Lung Cancer  Screening with LDCT placed in Epic. Yes (CT Chest Lung Cancer Screening Low Dose W/O CM) [id number] Z12.2-Screening of respiratory organs Z87.891-Personal history of nicotine dependence  I have spent 25 minutes of face to face time with Mr. Byrom discussing the risks and benefits of lung cancer screening. We viewed a power point together that explained in detail the above noted topics. We paused at intervals to allow for questions to be asked and answered to ensure understanding.We discussed that the single most powerful action that he can take to decrease his risk of developing lung cancer is to quit smoking. We discussed whether or not he is ready to commit to setting a quit date.  He is not ready to set a quit date.We discussed options for tools to aid in quitting smoking including nicotine replacement therapy, non-nicotine medications, support groups, Quit Smart classes, and behavior modification. We discussed that often times setting smaller, more achievable goals, such as eliminating 1 cigarette a day for a week and then 2 cigarettes a day for a week can be helpful in slowly decreasing the number of cigarettes smoked. This allows for a sense of accomplishment as well as providing a clinical benefit. I gave him the " Be Stronger Than Your Excuses" card with contact information for community resources, classes, free nicotine replacement therapy, and access to mobile apps, text messaging, and on-line smoking cessation help. I have also given him my card and contact information in the event he needs to contact me. We discussed the time and location of the scan, and that either Doroteo Glassman RN or I will call with the results within 24-48 hours of receiving them. I have offered him  a copy of the power point we viewed  as  a resource in the event they need reinforcement of the concepts we discussed today in the office. The patient verbalized understanding of all of  the above and had no further questions upon  leaving the office. They have my contact information in the event they have any further questions.  I spent 3 minutes counseling on smoking cessation and the health risks of continued tobacco abuse.  I explained to the patient that there has been a high incidence of coronary artery disease noted on these exams. I explained that this is a non-gated exam therefore degree or severity cannot be determined. This patient is not on statin therapy. I have asked the patient to follow-up with their PCP regarding any incidental finding of coronary artery disease and management with diet or medication as their PCP  feels is clinically indicated. The patient verbalized understanding of the above and had no further questions upon completion of the visit.      Magdalen Spatz, NP 09/02/2017 3:02 PM

## 2017-09-04 ENCOUNTER — Other Ambulatory Visit: Payer: Self-pay | Admitting: Acute Care

## 2017-09-04 DIAGNOSIS — F17210 Nicotine dependence, cigarettes, uncomplicated: Principal | ICD-10-CM

## 2017-09-04 DIAGNOSIS — Z122 Encounter for screening for malignant neoplasm of respiratory organs: Secondary | ICD-10-CM

## 2017-09-17 NOTE — Progress Notes (Signed)
CT scan did show evidence for calcifications around his heart. Fu for his elevated cholesterol is very important.

## 2017-09-17 NOTE — Progress Notes (Signed)
Patient is coming back into the office to see PCP in June.

## 2017-11-12 ENCOUNTER — Ambulatory Visit: Payer: BC Managed Care – PPO | Admitting: Family Medicine

## 2017-11-15 ENCOUNTER — Ambulatory Visit (INDEPENDENT_AMBULATORY_CARE_PROVIDER_SITE_OTHER): Payer: BC Managed Care – PPO | Admitting: Family Medicine

## 2017-11-15 ENCOUNTER — Encounter: Payer: Self-pay | Admitting: Family Medicine

## 2017-11-15 VITALS — BP 136/82 | HR 83 | Temp 98.7°F | Ht 75.0 in | Wt 268.0 lb

## 2017-11-15 DIAGNOSIS — I2510 Atherosclerotic heart disease of native coronary artery without angina pectoris: Secondary | ICD-10-CM

## 2017-11-15 DIAGNOSIS — R739 Hyperglycemia, unspecified: Secondary | ICD-10-CM

## 2017-11-15 DIAGNOSIS — E7800 Pure hypercholesterolemia, unspecified: Secondary | ICD-10-CM | POA: Diagnosis not present

## 2017-11-15 DIAGNOSIS — Z720 Tobacco use: Secondary | ICD-10-CM | POA: Diagnosis not present

## 2017-11-15 LAB — LIPID PANEL
Cholesterol: 226 mg/dL — ABNORMAL HIGH (ref 0–200)
HDL: 53.5 mg/dL (ref >39.00)
LDL Cholesterol: 157 mg/dL — ABNORMAL HIGH (ref 0–99)
NonHDL: 172.43
Total CHOL/HDL Ratio: 4
Triglycerides: 76 mg/dL (ref 0.0–149.0)
VLDL: 15.2 mg/dL (ref 0.0–40.0)

## 2017-11-15 LAB — BASIC METABOLIC PANEL
BUN: 11 mg/dL (ref 6–23)
CO2: 25 mEq/L (ref 19–32)
Calcium: 9.3 mg/dL (ref 8.4–10.5)
Chloride: 107 mEq/L (ref 96–112)
Creatinine, Ser: 0.98 mg/dL (ref 0.40–1.50)
GFR: 100.63 mL/min (ref >60.00)
Glucose, Bld: 106 mg/dL — ABNORMAL HIGH (ref 70–99)
Potassium: 3.8 mEq/L (ref 3.5–5.1)
Sodium: 140 mEq/L (ref 135–145)

## 2017-11-15 LAB — HEMOGLOBIN A1C: Hgb A1c MFr Bld: 6.2 % (ref 4.6–6.5)

## 2017-11-15 MED ORDER — ATORVASTATIN CALCIUM 20 MG PO TABS: 20.0000 mg | ORAL_TABLET | Freq: Every day | ORAL | 1 refills | Status: DC

## 2017-11-15 MED ORDER — VARENICLINE TARTRATE 0.5 MG X 11 & 1 MG X 42 PO MISC: ORAL | 0 refills | Status: DC

## 2017-11-15 MED ORDER — ASPIRIN EC 81 MG PO TBEC: 81.0000 mg | DELAYED_RELEASE_TABLET | Freq: Every day | ORAL | 3 refills | Status: DC

## 2017-11-15 NOTE — Progress Notes (Signed)
Subjective:  Patient ID: DYMIR NEESON, male    DOB: 1960/11/16  Age: 57 y.o. MRN: [id number]  CC: Follow-up   HPI Clifford Newman presents for follow-up of his CT scan of his chest and blood work.  He has made some positive changes.  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  He is interested in quitting smoking and would like to try Chantix.  He has cut back on the amount of fat and cholesterol in his diet.  He has no history of diabetes.  He denies frequency of urination or weight loss.  He is not been having shortness of breath chest pain nausea vomiting or diaphoresis.  Outpatient Medications Prior to Visit  Medication Sig Dispense Refill  . fluticasone (FLONASE) 50 MCG/ACT nasal spray Place 2 sprays into both nostrils daily. 16 g 6  . sildenafil (VIAGRA) 100 MG tablet Take 0.5-1 tablets (50-100 mg total) by mouth daily as needed for erectile dysfunction. 10 tablet 5   No facility-administered medications prior to visit.     ROS Review of Systems  Constitutional: Negative for chills, diaphoresis, fatigue, fever and unexpected weight change.  HENT: Negative.   Eyes: Negative.   Respiratory: Negative for chest tightness and shortness of breath.   Cardiovascular: Negative for chest pain and palpitations.  Gastrointestinal: Negative.   Endocrine: Negative for polyphagia and polyuria.  Genitourinary: Negative.   Musculoskeletal: Negative for gait problem and joint swelling.  Skin: Negative.   Allergic/Immunologic: Negative for immunocompromised state.  Neurological: Negative for weakness and headaches.  Hematological: Does not bruise/bleed easily.  Psychiatric/Behavioral: Negative.     Objective:  BP 136/82   Pulse 83   Temp 98.7 F (37.1 C)   Ht 6\' 3"  (1.905 m)   Wt 268 lb (121.6 kg)   SpO2 98%   BMI 33.50 kg/m   BP Readings from Last 3 Encounters:  11/15/17 136/82  08/12/17  128/80  07/08/17 128/72    Wt Readings from Last 3 Encounters:  11/15/17 268 lb (121.6 kg)  08/12/17 267 lb 8 oz (121.3 kg)  07/08/17 270 lb (122.5 kg)    Physical Exam  Constitutional: He is oriented to person, place, and time. He appears well-developed and well-nourished. No distress.  HENT:  Head: Normocephalic and atraumatic.  Right Ear: External ear normal.  Left Ear: External ear normal.  Eyes: Right eye exhibits no discharge. Left eye exhibits no discharge. No scleral icterus.  Neck: No JVD present. No tracheal deviation present.  Pulmonary/Chest: Breath sounds normal.  Musculoskeletal: He exhibits no edema.  Neurological: He is alert and oriented to person, place, and time.  Skin: He is not diaphoretic.  Psychiatric: He has a normal mood and affect. His behavior is normal. Thought content normal.    Lab Results  Component Value Date   WBC 3.3 (L) 08/12/2017   HGB 15.3 08/12/2017   HCT 45.6 08/12/2017   PLT 191.0 08/12/2017   GLUCOSE 119 (H) 08/12/2017   CHOL 232 (H) 08/12/2017   TRIG 81.0 08/12/2017   HDL 55.80 08/12/2017   LDLDIRECT 167.0 10/31/2012   LDLCALC 160 (H) 08/12/2017   ALT 15 08/12/2017   AST 14 08/12/2017   NA 139 08/12/2017   K 4.0 08/12/2017   CL 105 08/12/2017   CREATININE 0.88 08/12/2017   BUN 12 08/12/2017   CO2 26 08/12/2017   TSH 1.85  08/12/2017   PSA 2.17 08/12/2017   HGBA1C 6.0 08/10/2016    Ct Chest Lung Ca Screen Low Dose W/o Cm  Result Date: 09/03/2017 CLINICAL DATA:  Fifty-five pack-year history. Asymptomatic current smoker. EXAM: CT CHEST WITHOUT CONTRAST LOW-DOSE FOR LUNG CANCER SCREENING TECHNIQUE: Multidetector CT imaging of the chest was performed following the standard protocol without IV contrast. COMPARISON:  None FINDINGS: Cardiovascular: Normal heart size. Aortic atherosclerosis. Calcification within the RCA and LAD coronary arteries. Mediastinum/Nodes: No enlarged mediastinal, hilar, or axillary lymph nodes. Thyroid  gland, trachea, and esophagus demonstrate no significant findings. Lungs/Pleura: Mild changes of emphysema. No pleural effusion or airspace consolidation. Small pulmonary nodule in the right middle lobe has an equivalent diameter 1.8 mm. No additional nodules identified. Upper Abdomen: No acute abnormality. Musculoskeletal: No chest wall mass or suspicious bone lesions identified. IMPRESSION: 1. Lung-RADS 2, benign appearance or behavior. Continue annual screening with low-dose chest CT without contrast in 12 months. 2. Aortic Atherosclerosis (ICD10-I70.0) and Emphysema (ICD10-J43.9). 3. RCA and LAD coronary artery calcifications. Electronically Signed   By: Kerby Moors M.D.   On: 09/03/2017 10:33    Assessment & Plan:   Donn was seen today for follow-up.  Diagnoses and all orders for this visit:  Tobacco abuse -     Discontinue: varenicline (CHANTIX PAK) 0.5 MG X 11 & 1 MG X 42 tablet; Take one 0.5 mg tablet by mouth once daily for 3 days, then increase to one 0.5 mg tablet twice daily for 4 days, then increase to one 1 mg tablet twice daily. -     varenicline (CHANTIX PAK) 0.5 MG X 11 & 1 MG X 42 tablet; Take one 0.5 mg tablet by mouth once daily for 3 days, then increase to one 0.5 mg tablet twice daily for 4 days, then increase to one 1 mg tablet twice daily.  Elevated LDL cholesterol level -     atorvastatin (LIPITOR) 20 MG tablet; Take 1 tablet (20 mg total) by mouth daily. -     aspirin EC 81 MG tablet; Take 1 tablet (81 mg total) by mouth daily. -     Lipid panel  ASCVD (arteriosclerotic cardiovascular disease) -     atorvastatin (LIPITOR) 20 MG tablet; Take 1 tablet (20 mg total) by mouth daily. -     aspirin EC 81 MG tablet; Take 1 tablet (81 mg total) by mouth daily.  Elevated blood sugar -     Hemoglobin A1c -     Basic metabolic panel   I am having Arlester Marker start on atorvastatin and aspirin EC. I am also having him maintain his fluticasone, sildenafil, and  varenicline.  Meds ordered this encounter  Medications  . atorvastatin (LIPITOR) 20 MG tablet    Sig: Take 1 tablet (20 mg total) by mouth daily.    Dispense:  90 tablet    Refill:  1  . aspirin EC 81 MG tablet    Sig: Take 1 tablet (81 mg total) by mouth daily.    Dispense:  365 tablet    Refill:  3  . DISCONTD: varenicline (CHANTIX PAK) 0.5 MG X 11 & 1 MG X 42 tablet    Sig: Take one 0.5 mg tablet by mouth once daily for 3 days, then increase to one 0.5 mg tablet twice daily for 4 days, then increase to one 1 mg tablet twice daily.    Dispense:  53 tablet    Refill:  0  . varenicline (CHANTIX  PAK) 0.5 MG X 11 & 1 MG X 42 tablet    Sig: Take one 0.5 mg tablet by mouth once daily for 3 days, then increase to one 0.5 mg tablet twice daily for 4 days, then increase to one 1 mg tablet twice daily.    Dispense:  53 tablet    Refill:  0   We discussed the results of his CT scan.  It did show signs for early COPD.  He knows that if he were to quit smoking now this may prevent further development of COPD.  Scan did show arterial sclerosis in his aorta.  There were calcifications in his coronary arteries.  I told him that there is a correlation between this and blockages in those arteries.  I felt as though this was enough evidence for him to start a statin and aspirin therapy.  He agreed and we will begin Lipitor.  He knows to look out for myalgias.  He would look would like to try Chantix.  We discussed that as well.  He is to let me know about any bad dreams or homicidal thoughts.  We may need to continue Chantix for 2 to 3 months.  Follow-up: Return Return in 2-3 mos for follow up. Libby Maw, MD

## 2017-11-15 NOTE — Patient Instructions (Signed)
Varenicline oral tablets What is this medicine? VARENICLINE (var EN i kleen) is used to help people quit smoking. It can reduce the symptoms caused by stopping smoking. It is used with a patient support program recommended by your physician. This medicine may be used for other purposes; ask your health care provider or pharmacist if you have questions. COMMON BRAND NAME(S): Chantix What should I tell my health care provider before I take this medicine? They need to know if you have any of these conditions: -bipolar disorder, depression, schizophrenia or other mental illness -heart disease -if you often drink alcohol -kidney disease -peripheral vascular disease -seizures -stroke -suicidal thoughts, plans, or attempt; a previous suicide attempt by you or a family member -an unusual or allergic reaction to varenicline, other medicines, foods, dyes, or preservatives -pregnant or trying to get pregnant -breast-feeding How should I use this medicine? Take this medicine by mouth after eating. Take with a full glass of water. Follow the directions on the prescription label. Take your doses at regular intervals. Do not take your medicine more often than directed. There are 3 ways you can use this medicine to help you quit smoking; talk to your health care professional to decide which plan is right for you: 1) you can choose a quit date and start this medicine 1 week before the quit date, or, 2) you can start taking this medicine before you choose a quit date, and then pick a quit date between day 8 and 35 days of treatment, or, 3) if you are not sure that you are able or willing to quit smoking right away, start taking this medicine and slowly decrease the amount you smoke as directed by your health care professional with the goal of being cigarette-free by week 12 of treatment. Stick to your plan; ask about support groups or other ways to help you remain cigarette-free. If you are motivated to quit  smoking and did not succeed during a previous attempt with this medicine for reasons other than side effects, or if you returned to smoking after this treatment, speak with your health care professional about whether another course of this medicine may be right for you. A special MedGuide will be given to you by the pharmacist with each prescription and refill. Be sure to read this information carefully each time. Talk to your pediatrician regarding the use of this medicine in children. This medicine is not approved for use in children. Overdosage: If you think you have taken too much of this medicine contact a poison control center or emergency room at once. NOTE: This medicine is only for you. Do not share this medicine with others. What if I miss a dose? If you miss a dose, take it as soon as you can. If it is almost time for your next dose, take only that dose. Do not take double or extra doses. What may interact with this medicine? -alcohol or any product that contains alcohol -insulin -other stop smoking aids -theophylline -warfarin This list may not describe all possible interactions. Give your health care provider a list of all the medicines, herbs, non-prescription drugs, or dietary supplements you use. Also tell them if you smoke, drink alcohol, or use illegal drugs. Some items may interact with your medicine. What should I watch for while using this medicine? Visit your doctor or health care professional for regular check ups. Ask for ongoing advice and encouragement from your doctor or healthcare professional, friends, and family to help you quit. If   you smoke while on this medication, quit again Your mouth may get dry. Chewing sugarless gum or sucking hard candy, and drinking plenty of water may help. Contact your doctor if the problem does not go away or is severe. You may get drowsy or dizzy. Do not drive, use machinery, or do anything that needs mental alertness until you know how  this medicine affects you. Do not stand or sit up quickly, especially if you are an older patient. This reduces the risk of dizzy or fainting spells. Sleepwalking can happen during treatment with this medicine, and can sometimes lead to behavior that is harmful to you, other people, or property. Stop taking this medicine and tell your doctor if you start sleepwalking or have other unusual sleep-related activity. Decrease the amount of alcoholic beverages that you drink during treatment with this medicine until you know if this medicine affects your ability to tolerate alcohol. Some people have experienced increased drunkenness (intoxication), unusual or sometimes aggressive behavior, or no memory of things that have happened (amnesia) during treatment with this medicine. The use of this medicine may increase the chance of suicidal thoughts or actions. Pay special attention to how you are responding while on this medicine. Any worsening of mood, or thoughts of suicide or dying should be reported to your health care professional right away. What side effects may I notice from receiving this medicine? Side effects that you should report to your doctor or health care professional as soon as possible: -allergic reactions like skin rash, itching or hives, swelling of the face, lips, tongue, or throat -acting aggressive, being angry or violent, or acting on dangerous impulses -breathing problems -changes in vision -chest pain or chest tightness -confusion, trouble speaking or understanding -new or worsening depression, anxiety, or panic attacks -extreme increase in activity and talking (mania) -fast, irregular heartbeat -feeling faint or lightheaded, falls -fever -pain in legs when walking -problems with balance, talking, walking -redness, blistering, peeling or loosening of the skin, including inside the mouth -ringing in ears -seeing or hearing things that aren't there  (hallucinations) -seizures -sleepwalking -sudden numbness or weakness of the face, arm or leg -thoughts about suicide or dying, or attempts to commit suicide -trouble passing urine or change in the amount of urine -unusual bleeding or bruising -unusually weak or tired Side effects that usually do not require medical attention (report to your doctor or health care professional if they continue or are bothersome): -constipation -headache -nausea, vomiting -strange dreams -stomach gas -trouble sleeping This list may not describe all possible side effects. Call your doctor for medical advice about side effects. You may report side effects to FDA at 1-800-FDA-1088. Where should I keep my medicine? Keep out of the reach of children. Store at room temperature between 15 and 30 degrees C (59 and 86 degrees F). Throw away any unused medicine after the expiration date. NOTE: This sheet is a summary. It may not cover all possible information. If you have questions about this medicine, talk to your doctor, pharmacist, or health care provider.  2018 Elsevier/Gold Standard (2015-01-20 16:14:23) Atorvastatin tablets What is this medicine? ATORVASTATIN (a TORE va sta tin) is known as a HMG-CoA reductase inhibitor or 'statin'. It lowers the level of cholesterol and triglycerides in the blood. This drug may also reduce the risk of heart attack, stroke, or other health problems in patients with risk factors for heart disease. Diet and lifestyle changes are often used with this drug. This medicine may be used for other  purposes; ask your health care provider or pharmacist if you have questions. COMMON BRAND NAME(S): Lipitor What should I tell my health care provider before I take this medicine? They need to know if you have any of these conditions: -frequently drink alcoholic beverages -history of stroke, TIA -kidney disease -liver disease -muscle aches or weakness -other medical condition -an unusual  or allergic reaction to atorvastatin, other medicines, foods, dyes, or preservatives -pregnant or trying to get pregnant -breast-feeding How should I use this medicine? Take this medicine by mouth with a glass of water. Follow the directions on the prescription label. You can take this medicine with or without food. Take your doses at regular intervals. Do not take your medicine more often than directed. Talk to your pediatrician regarding the use of this medicine in children. While this drug may be prescribed for children as young as 52 years old for selected conditions, precautions do apply. Overdosage: If you think you have taken too much of this medicine contact a poison control center or emergency room at once. NOTE: This medicine is only for you. Do not share this medicine with others. What if I miss a dose? If you miss a dose, take it as soon as you can. If it is almost time for your next dose, take only that dose. Do not take double or extra doses. What may interact with this medicine? Do not take this medicine with any of the following medications: -red yeast rice -telaprevir -telithromycin -voriconazole This medicine may also interact with the following medications: -alcohol -antiviral medicines for HIV or AIDS -boceprevir -certain antibiotics like clarithromycin, erythromycin, troleandomycin -certain medicines for cholesterol like fenofibrate or gemfibrozil -cimetidine -clarithromycin -colchicine -cyclosporine -digoxin -male hormones, like estrogens or progestins and birth control pills -grapefruit juice -medicines for fungal infections like fluconazole, itraconazole, ketoconazole -niacin -rifampin -spironolactone This list may not describe all possible interactions. Give your health care provider a list of all the medicines, herbs, non-prescription drugs, or dietary supplements you use. Also tell them if you smoke, drink alcohol, or use illegal drugs. Some items may  interact with your medicine. What should I watch for while using this medicine? Visit your doctor or health care professional for regular check-ups. You may need regular tests to make sure your liver is working properly. Tell your doctor or health care professional right away if you get any unexplained muscle pain, tenderness, or weakness, especially if you also have a fever and tiredness. Your doctor or health care professional may tell you to stop taking this medicine if you develop muscle problems. If your muscle problems do not go away after stopping this medicine, contact your health care professional. This drug is only part of a total heart-health program. Your doctor or a dietician can suggest a low-cholesterol and low-fat diet to help. Avoid alcohol and smoking, and keep a proper exercise schedule. Do not use this drug if you are pregnant or breast-feeding. Serious side effects to an unborn child or to an infant are possible. Talk to your doctor or pharmacist for more information. This medicine may affect blood sugar levels. If you have diabetes, check with your doctor or health care professional before you change your diet or the dose of your diabetic medicine. If you are going to have surgery tell your health care professional that you are taking this drug. What side effects may I notice from receiving this medicine? Side effects that you should report to your doctor or health care professional as soon as  possible: -allergic reactions like skin rash, itching or hives, swelling of the face, lips, or tongue -dark urine -fever -joint pain -muscle cramps, pain -redness, blistering, peeling or loosening of the skin, including inside the mouth -trouble passing urine or change in the amount of urine -unusually weak or tired -yellowing of eyes or skin Side effects that usually do not require medical attention (report to your doctor or health care professional if they continue or are  bothersome): -constipation -heartburn -stomach gas, pain, upset This list may not describe all possible side effects. Call your doctor for medical advice about side effects. You may report side effects to FDA at 1-800-FDA-1088. Where should I keep my medicine? Keep out of the reach of children. Store at room temperature between 20 to 25 degrees C (68 to 77 degrees F). Throw away any unused medicine after the expiration date. NOTE: This sheet is a summary. It may not cover all possible information. If you have questions about this medicine, talk to your doctor, pharmacist, or health care provider.  2018 Elsevier/Gold Standard (2011-03-27 78:97:84)

## 2017-11-18 ENCOUNTER — Encounter: Payer: Self-pay | Admitting: Family Medicine

## 2017-11-18 ENCOUNTER — Other Ambulatory Visit: Payer: Self-pay

## 2018-02-17 ENCOUNTER — Encounter: Payer: Self-pay | Admitting: Family Medicine

## 2018-02-17 ENCOUNTER — Ambulatory Visit (INDEPENDENT_AMBULATORY_CARE_PROVIDER_SITE_OTHER): Payer: BC Managed Care – PPO | Admitting: Family Medicine

## 2018-02-17 VITALS — BP 130/76 | HR 85 | Ht 75.0 in | Wt 280.4 lb

## 2018-02-17 DIAGNOSIS — T887XXA Unspecified adverse effect of drug or medicament, initial encounter: Secondary | ICD-10-CM | POA: Diagnosis not present

## 2018-02-17 DIAGNOSIS — I2510 Atherosclerotic heart disease of native coronary artery without angina pectoris: Secondary | ICD-10-CM

## 2018-02-17 DIAGNOSIS — E7800 Pure hypercholesterolemia, unspecified: Secondary | ICD-10-CM

## 2018-02-17 DIAGNOSIS — Z23 Encounter for immunization: Secondary | ICD-10-CM

## 2018-02-17 MED ORDER — PRAVASTATIN SODIUM 20 MG PO TABS: 20.0000 mg | ORAL_TABLET | Freq: Every day | ORAL | 3 refills | Status: DC

## 2018-02-17 NOTE — Progress Notes (Signed)
Subjective:  Patient ID: Clifford Newman, male    DOB: 1960-06-10  Age: 57 y.o. MRN: [id number]  CC: Follow-up   HPI Clifford Newman presents for follow-up of tobacco abuse and elevated cholesterol with ASCVD.  He has noted persistent muscle soreness status post initiating atorvastatin therapy.  He never did try the Chantix out of concern about possible side effects but has managed to quit.  Feeling much better with more energy and has noticed a return of his sense of smell.  Outpatient Medications Prior to Visit  Medication Sig Dispense Refill  . aspirin EC 81 MG tablet Take 1 tablet (81 mg total) by mouth daily. 365 tablet 3  . atorvastatin (LIPITOR) 20 MG tablet Take 1 tablet (20 mg total) by mouth daily. 90 tablet 1  . fluticasone (FLONASE) 50 MCG/ACT nasal spray Place 2 sprays into both nostrils daily. 16 g 6  . sildenafil (VIAGRA) 100 MG tablet Take 0.5-1 tablets (50-100 mg total) by mouth daily as needed for erectile dysfunction. 10 tablet 5  . varenicline (CHANTIX PAK) 0.5 MG X 11 & 1 MG X 42 tablet Take one 0.5 mg tablet by mouth once daily for 3 days, then increase to one 0.5 mg tablet twice daily for 4 days, then increase to one 1 mg tablet twice daily. 53 tablet 0   No facility-administered medications prior to visit.     ROS Review of Systems  Constitutional: Negative.   Respiratory: Negative.   Cardiovascular: Negative.   Gastrointestinal: Negative.   Psychiatric/Behavioral: Negative.     Objective:  BP 130/76   Pulse 85   Ht 6\' 3"  (1.905 m)   Wt 280 lb 6 oz (127.2 kg)   SpO2 96%   BMI 35.04 kg/m   BP Readings from Last 3 Encounters:  02/17/18 130/76  11/15/17 136/82  08/12/17 128/80    Wt Readings from Last 3 Encounters:  02/17/18 280 lb 6 oz (127.2 kg)  11/15/17 268 lb (121.6 kg)  08/12/17 267 lb 8 oz (121.3 kg)    Physical Exam  Constitutional: He is oriented to person, place, and time. He appears well-developed and well-nourished. No distress.    Pulmonary/Chest: Effort normal.  Neurological: He is alert and oriented to person, place, and time.  Skin: He is not diaphoretic.  Psychiatric: He has a normal mood and affect. His behavior is normal.    Lab Results  Component Value Date   WBC 3.3 (L) 08/12/2017   HGB 15.3 08/12/2017   HCT 45.6 08/12/2017   PLT 191.0 08/12/2017   GLUCOSE 106 (H) 11/15/2017   CHOL 226 (H) 11/15/2017   TRIG 76.0 11/15/2017   HDL 53.50 11/15/2017   LDLDIRECT 167.0 10/31/2012   LDLCALC 157 (H) 11/15/2017   ALT 15 08/12/2017   AST 14 08/12/2017   NA 140 11/15/2017   K 3.8 11/15/2017   CL 107 11/15/2017   CREATININE 0.98 11/15/2017   BUN 11 11/15/2017   CO2 25 11/15/2017   TSH 1.85 08/12/2017   PSA 2.17 08/12/2017   HGBA1C 6.2 11/15/2017    Ct Chest Lung Ca Screen Low Dose W/o Cm  Result Date: 09/03/2017 CLINICAL DATA:  Fifty-five pack-year history. Asymptomatic current smoker. EXAM: CT CHEST WITHOUT CONTRAST LOW-DOSE FOR LUNG CANCER SCREENING TECHNIQUE: Multidetector CT imaging of the chest was performed following the standard protocol without IV contrast. COMPARISON:  None FINDINGS: Cardiovascular: Normal heart size. Aortic atherosclerosis. Calcification within the RCA and LAD coronary arteries. Mediastinum/Nodes: No enlarged mediastinal,  hilar, or axillary lymph nodes. Thyroid gland, trachea, and esophagus demonstrate no significant findings. Lungs/Pleura: Mild changes of emphysema. No pleural effusion or airspace consolidation. Small pulmonary nodule in the right middle lobe has an equivalent diameter 1.8 mm. No additional nodules identified. Upper Abdomen: No acute abnormality. Musculoskeletal: No chest wall mass or suspicious bone lesions identified. IMPRESSION: 1. Lung-RADS 2, benign appearance or behavior. Continue annual screening with low-dose chest CT without contrast in 12 months. 2. Aortic Atherosclerosis (ICD10-I70.0) and Emphysema (ICD10-J43.9). 3. RCA and LAD coronary artery  calcifications. Electronically Signed   By: Kerby Moors M.D.   On: 09/03/2017 10:33    Assessment & Plan:   Clifford Newman was seen today for follow-up.  Diagnoses and all orders for this visit:  Elevated LDL cholesterol level -     LDL cholesterol, direct -     pravastatin (PRAVACHOL) 20 MG tablet; Take 1 tablet (20 mg total) by mouth at bedtime.  Need for influenza vaccination -     Flu Vaccine QUAD 36+ mos IM  ASCVD (arteriosclerotic cardiovascular disease) -     LDL cholesterol, direct -     pravastatin (PRAVACHOL) 20 MG tablet; Take 1 tablet (20 mg total) by mouth at bedtime.  Medication side effect -     LDL cholesterol, direct -     Hepatic function panel   I am having Clifford Newman start on pravastatin. I am also having him maintain his fluticasone, sildenafil, atorvastatin, aspirin EC, and varenicline.  Meds ordered this encounter  Medications  . pravastatin (PRAVACHOL) 20 MG tablet    Sig: Take 1 tablet (20 mg total) by mouth at bedtime.    Dispense:  90 tablet    Refill:  3   Much congratulations for quitting smoking!  We will try Pravachol nightly.  Follow-up in 3 months.  Follow-up: Return in about 3 months (around 05/19/2018).  Libby Maw, MD

## 2018-02-18 ENCOUNTER — Encounter: Payer: Self-pay | Admitting: Family Medicine

## 2018-02-18 LAB — HEPATIC FUNCTION PANEL
ALT: 14 U/L (ref 0–53)
AST: 14 U/L (ref 0–37)
Albumin: 4.1 g/dL (ref 3.5–5.2)
Alkaline Phosphatase: 37 U/L — ABNORMAL LOW (ref 39–117)
Bilirubin, Direct: 0.1 mg/dL (ref 0.0–0.3)
Total Bilirubin: 0.5 mg/dL (ref 0.2–1.2)
Total Protein: 6.7 g/dL (ref 6.0–8.3)

## 2018-02-18 LAB — LDL CHOLESTEROL, DIRECT: Direct LDL: 129 mg/dL

## 2018-05-19 ENCOUNTER — Encounter: Payer: Self-pay | Admitting: Family Medicine

## 2018-05-19 ENCOUNTER — Ambulatory Visit (INDEPENDENT_AMBULATORY_CARE_PROVIDER_SITE_OTHER): Payer: BC Managed Care – PPO | Admitting: Family Medicine

## 2018-05-19 VITALS — BP 138/80 | HR 77 | Ht 75.0 in | Wt 286.4 lb

## 2018-05-19 DIAGNOSIS — N5201 Erectile dysfunction due to arterial insufficiency: Secondary | ICD-10-CM

## 2018-05-19 DIAGNOSIS — E782 Mixed hyperlipidemia: Secondary | ICD-10-CM | POA: Diagnosis not present

## 2018-05-19 DIAGNOSIS — M79672 Pain in left foot: Secondary | ICD-10-CM

## 2018-05-19 DIAGNOSIS — I2510 Atherosclerotic heart disease of native coronary artery without angina pectoris: Secondary | ICD-10-CM | POA: Diagnosis not present

## 2018-05-19 DIAGNOSIS — M79671 Pain in right foot: Secondary | ICD-10-CM | POA: Diagnosis not present

## 2018-05-19 DIAGNOSIS — E7800 Pure hypercholesterolemia, unspecified: Secondary | ICD-10-CM | POA: Diagnosis not present

## 2018-05-19 LAB — COMPREHENSIVE METABOLIC PANEL
ALT: 16 U/L (ref 0–53)
AST: 13 U/L (ref 0–37)
Albumin: 4 g/dL (ref 3.5–5.2)
Alkaline Phosphatase: 40 U/L (ref 39–117)
BUN: 11 mg/dL (ref 6–23)
CO2: 25 mEq/L (ref 19–32)
Calcium: 9.2 mg/dL (ref 8.4–10.5)
Chloride: 105 mEq/L (ref 96–112)
Creatinine, Ser: 1.01 mg/dL (ref 0.40–1.50)
GFR: 97.02 mL/min (ref >60.00)
Glucose, Bld: 102 mg/dL — ABNORMAL HIGH (ref 70–99)
Potassium: 4.1 mEq/L (ref 3.5–5.1)
Sodium: 137 mEq/L (ref 135–145)
Total Bilirubin: 0.5 mg/dL (ref 0.2–1.2)
Total Protein: 6.5 g/dL (ref 6.0–8.3)

## 2018-05-19 LAB — CBC
HCT: 44.5 % (ref 39.0–52.0)
Hemoglobin: 14.9 g/dL (ref 13.0–17.0)
MCHC: 33.4 g/dL (ref 30.0–36.0)
MCV: 88.9 fl (ref 78.0–100.0)
Platelets: 183 10*3/uL (ref 150.0–400.0)
RBC: 5 Mil/uL (ref 4.22–5.81)
RDW: 14 % (ref 11.5–15.5)
WBC: 3.1 10*3/uL — ABNORMAL LOW (ref 4.0–10.5)

## 2018-05-19 LAB — LIPID PANEL
Cholesterol: 246 mg/dL — ABNORMAL HIGH (ref 0–200)
HDL: 62.6 mg/dL (ref >39.00)
LDL Cholesterol: 171 mg/dL — ABNORMAL HIGH (ref 0–99)
NonHDL: 183.87
Total CHOL/HDL Ratio: 4
Triglycerides: 63 mg/dL (ref 0.0–149.0)
VLDL: 12.6 mg/dL (ref 0.0–40.0)

## 2018-05-19 LAB — LDL CHOLESTEROL, DIRECT: Direct LDL: 173 mg/dL

## 2018-05-19 MED ORDER — SILDENAFIL CITRATE 20 MG PO TABS: ORAL_TABLET | ORAL | 1 refills | Status: DC

## 2018-05-19 MED ORDER — IBUPROFEN-FAMOTIDINE 800-26.6 MG PO TABS: ORAL_TABLET | ORAL | 0 refills | Status: DC

## 2018-05-19 NOTE — Progress Notes (Signed)
Established Patient Office Visit  Subjective:  Patient ID: Clifford Newman, male    DOB: 1960/05/27  Age: 57 y.o. MRN: [id number]  CC:  Chief Complaint  Patient presents with  . Follow-up    HPI Clifford Newman presents for follow-up of his LDL cholesterol and history of vascular disease.  He does have some issues with bilateral foot pain comes and goes.  He is taking Duexis in the past for this and it seemed to help a great deal.  He believes that he is tolerating the pravastatin and is taking it at night.  He has taken Viagra in the past for ED issues with success.  Insurance is no longer covering titrate Viagra.  No tobacco since November!  He has gained weight but is planning on returning to the gym.  He is taking an aspirin daily.  Past Medical History:  Diagnosis Date  . Colon polyp 2001   Dr Fuller Plan  . Hyperlipidemia     Past Surgical History:  Procedure Laterality Date  . COLONOSCOPY  2006 & 2011   negative, Dr Fuller Plan  . COLONOSCOPY W/ POLYPECTOMY  2001  . CYSTECTOMY     Ganglion cyst-left wrist   . HAND SURGERY  1983   Right thumb surgery   . ROTATOR CUFF REPAIR Right   . WISDOM TOOTH EXTRACTION      Family History  Problem Relation Age of Onset  . Colon cancer Father 3  . Diabetes Father        IDDM  . Stroke Paternal Grandfather        >55  . Stroke Paternal Grandmother        > 65  . Heart disease Neg Hx     Social History   Socioeconomic History  . Marital status: Married    Spouse name: Not on file  . Number of children: Not on file  . Years of education: Not on file  . Highest education level: Not on file  Occupational History  . Not on file  Social Needs  . Financial resource strain: Not on file  . Food insecurity:    Worry: Not on file    Inability: Not on file  . Transportation needs:    Medical: Not on file    Non-medical: Not on file  Tobacco Use  . Smoking status: Former Smoker    Packs/day: 1.50    Years: 37.00    Pack years:  55.50    Types: Cigarettes    Last attempt to quit: 12/20/2014    Years since quitting: 3.4  . Smokeless tobacco: Current User  . Tobacco comment: see Problem List, e-cig without nicotine  Substance and Sexual Activity  . Alcohol use: Yes    Alcohol/week: 2.0 standard drinks    Types: 2 Shots of liquor per week  . Drug use: No  . Sexual activity: Not on file  Lifestyle  . Physical activity:    Days per week: Not on file    Minutes per session: Not on file  . Stress: Not on file  Relationships  . Social connections:    Talks on phone: Not on file    Gets together: Not on file    Attends religious service: Not on file    Active member of club or organization: Not on file    Attends meetings of clubs or organizations: Not on file    Relationship status: Not on file  . Intimate partner violence:  Fear of current or ex partner: Not on file    Emotionally abused: Not on file    Physically abused: Not on file    Forced sexual activity: Not on file  Other Topics Concern  . Not on file  Social History Narrative  . Not on file    Outpatient Medications Prior to Visit  Medication Sig Dispense Refill  . aspirin EC 81 MG tablet Take 1 tablet (81 mg total) by mouth daily. 365 tablet 3  . fluticasone (FLONASE) 50 MCG/ACT nasal spray Place 2 sprays into both nostrils daily. 16 g 6  . pravastatin (PRAVACHOL) 20 MG tablet Take 1 tablet (20 mg total) by mouth at bedtime. 90 tablet 3  . atorvastatin (LIPITOR) 20 MG tablet Take 1 tablet (20 mg total) by mouth daily. 90 tablet 1  . sildenafil (VIAGRA) 100 MG tablet Take 0.5-1 tablets (50-100 mg total) by mouth daily as needed for erectile dysfunction. 10 tablet 5  . varenicline (CHANTIX PAK) 0.5 MG X 11 & 1 MG X 42 tablet Take one 0.5 mg tablet by mouth once daily for 3 days, then increase to one 0.5 mg tablet twice daily for 4 days, then increase to one 1 mg tablet twice daily. 53 tablet 0   No facility-administered medications prior to  visit.     No Known Allergies  ROS Review of Systems  Constitutional: Negative for diaphoresis, fatigue, fever and unexpected weight change.  HENT: Negative.   Eyes: Negative for photophobia and visual disturbance.  Respiratory: Negative.   Cardiovascular: Negative.   Gastrointestinal: Negative.   Endocrine: Negative for polyphagia and polyuria.  Genitourinary: Negative.   Musculoskeletal: Positive for arthralgias.  Skin: Negative for pallor and rash.  Allergic/Immunologic: Negative for immunocompromised state.  Neurological: Negative for seizures, syncope and speech difficulty.  Hematological: Does not bruise/bleed easily.  Psychiatric/Behavioral: Negative.       Objective:    Physical Exam  Constitutional: He is oriented to person, place, and time. He appears well-developed and well-nourished. No distress.  HENT:  Head: Normocephalic and atraumatic.  Right Ear: External ear normal.  Left Ear: External ear normal.  Mouth/Throat: Oropharynx is clear and moist. No oropharyngeal exudate.  Eyes: Pupils are equal, round, and reactive to light. Conjunctivae are normal. Right eye exhibits no discharge. No scleral icterus.  Neck: Neck supple. No JVD present. No tracheal deviation present. No thyromegaly present.  Cardiovascular: Normal rate, regular rhythm and normal heart sounds.  Pulmonary/Chest: Effort normal and breath sounds normal. No stridor.  Lymphadenopathy:    He has no cervical adenopathy.  Neurological: He is alert and oriented to person, place, and time.  Skin: Skin is warm and dry. He is not diaphoretic.  Psychiatric: He has a normal mood and affect. His behavior is normal.    BP 138/80   Pulse 77   Ht 6\' 3"  (1.905 m)   Wt 286 lb 6 oz (129.9 kg)   SpO2 94%   BMI 35.79 kg/m  Wt Readings from Last 3 Encounters:  05/19/18 286 lb 6 oz (129.9 kg)  02/17/18 280 lb 6 oz (127.2 kg)  11/15/17 268 lb (121.6 kg)   BP Readings from Last 3 Encounters:  05/19/18  138/80  02/17/18 130/76  11/15/17 136/82   Guideline developer:  UpToDate (see UpToDate for funding source) Date Released: June 2014  There are no preventive care reminders to display for this patient.  There are no preventive care reminders to display for this patient.  Lab  Results  Component Value Date   TSH 1.85 08/12/2017   Lab Results  Component Value Date   WBC 3.3 (L) 08/12/2017   HGB 15.3 08/12/2017   HCT 45.6 08/12/2017   MCV 91.0 08/12/2017   PLT 191.0 08/12/2017   Lab Results  Component Value Date   NA 140 11/15/2017   K 3.8 11/15/2017   CO2 25 11/15/2017   GLUCOSE 106 (H) 11/15/2017   BUN 11 11/15/2017   CREATININE 0.98 11/15/2017   BILITOT 0.5 02/17/2018   ALKPHOS 37 (L) 02/17/2018   AST 14 02/17/2018   ALT 14 02/17/2018   PROT 6.7 02/17/2018   ALBUMIN 4.1 02/17/2018   CALCIUM 9.3 11/15/2017   GFR 100.63 11/15/2017   Lab Results  Component Value Date   CHOL 226 (H) 11/15/2017   Lab Results  Component Value Date   HDL 53.50 11/15/2017   Lab Results  Component Value Date   LDLCALC 157 (H) 11/15/2017   Lab Results  Component Value Date   TRIG 76.0 11/15/2017   Lab Results  Component Value Date   CHOLHDL 4 11/15/2017   Lab Results  Component Value Date   HGBA1C 6.2 11/15/2017      Assessment & Plan:   Problem List Items Addressed This Visit      Cardiovascular and Mediastinum   ASCVD (arteriosclerotic cardiovascular disease) - Primary   Relevant Medications   sildenafil (REVATIO) 20 MG tablet   Other Relevant Orders   LDL cholesterol, direct   Lipid panel   Erectile dysfunction due to arterial insufficiency   Relevant Medications   sildenafil (REVATIO) 20 MG tablet     Other   Hyperlipidemia   Relevant Medications   sildenafil (REVATIO) 20 MG tablet   Other Relevant Orders   CBC   Comprehensive metabolic panel   LDL cholesterol, direct   Lipid panel   Elevated LDL cholesterol level   Relevant Orders   LDL  cholesterol, direct   Lipid panel   Pain in both feet   Relevant Medications   Ibuprofen-Famotidine 800-26.6 MG TABS   Other Relevant Orders   Ambulatory referral to Sports Medicine      Meds ordered this encounter  Medications  . sildenafil (REVATIO) 20 MG tablet    Sig: Take two to five daily 45 minutes prior as needed.    Dispense:  50 tablet    Refill:  1  . Ibuprofen-Famotidine 800-26.6 MG TABS    Sig: Take one every 8 hours as needed for feet pain with food.    Dispense:  90 tablet    Refill:  0    Follow-up: Return in about 3 months (around 08/18/2018).

## 2018-05-20 ENCOUNTER — Encounter: Payer: Self-pay | Admitting: Family Medicine

## 2018-05-20 ENCOUNTER — Ambulatory Visit (INDEPENDENT_AMBULATORY_CARE_PROVIDER_SITE_OTHER): Payer: BC Managed Care – PPO | Admitting: Family Medicine

## 2018-05-20 ENCOUNTER — Ambulatory Visit (INDEPENDENT_AMBULATORY_CARE_PROVIDER_SITE_OTHER): Payer: BC Managed Care – PPO

## 2018-05-20 ENCOUNTER — Ambulatory Visit: Payer: BC Managed Care – PPO | Admitting: Family Medicine

## 2018-05-20 VITALS — BP 138/74 | HR 85 | Temp 98.7°F | Ht 75.0 in | Wt 288.8 lb

## 2018-05-20 DIAGNOSIS — M79672 Pain in left foot: Secondary | ICD-10-CM | POA: Diagnosis not present

## 2018-05-20 DIAGNOSIS — M79671 Pain in right foot: Secondary | ICD-10-CM

## 2018-05-20 DIAGNOSIS — I2510 Atherosclerotic heart disease of native coronary artery without angina pectoris: Secondary | ICD-10-CM

## 2018-05-20 DIAGNOSIS — E7800 Pure hypercholesterolemia, unspecified: Secondary | ICD-10-CM

## 2018-05-20 MED ORDER — PRAVASTATIN SODIUM 20 MG PO TABS: 20.0000 mg | ORAL_TABLET | Freq: Every day | ORAL | 3 refills | Status: DC

## 2018-05-20 NOTE — Patient Instructions (Signed)
Good to see you  We could try some custom orthotics for your foot pain. You can set up an appointment to do this in the future  Happy New Year.

## 2018-05-20 NOTE — Assessment & Plan Note (Signed)
It appears that he has a ganglion cyst on the lateral midfoot on the right foot.  Unclear if this is related to structural changes.  He does appear to have a flatter foot on the right as opposed to the left.  Likely has a component of pes planus that is contributing to some of his pain. -Aspiration of the cyst on the right foot today. -Counseled on supportive care. -Can consider custom orthotics

## 2018-05-20 NOTE — Progress Notes (Signed)
[name] - 57 y.o. male MRN [id number]  Date of birth: May 06, 1961  SUBJECTIVE:  Including CC & ROS.  Chief Complaint  Patient presents with  . Foot Pain    pain in both feet    Clifford Newman is a 57 y.o. male that is presenting with bilateral feet pain.  The pain is occurring over the lateral midfoot.  He points to the cuboid and the base of the fifth metatarsal.  He denies any injury.  He describes the pain as a constant and worse at the end of the day.  The pain is throbbing in nature.  He notices a bump on the right lateral component of his right foot which causes him pain as well.  He denies any trauma.  Feels like the pain is staying the same.  Independent review of the left and right foot x-ray from 3/7 shows severe degenerative changes of the left first MTP joint.  The right foot x-ray also shows severe degenerative changes of the first MTP joint.   Review of Systems  Constitutional: Negative for fever.  HENT: Negative for congestion.   Respiratory: Negative for cough.   Cardiovascular: Negative for chest pain.  Gastrointestinal: Negative for abdominal pain.  Musculoskeletal: Positive for gait problem.  Skin: Negative for color change.  Neurological: Negative for weakness.  Hematological: Negative for adenopathy.  Psychiatric/Behavioral: Negative for agitation.    HISTORY: Past Medical, Surgical, Social, and Family History Reviewed & Updated per EMR.   Pertinent Historical Findings include:  Past Medical History:  Diagnosis Date  . Colon polyp 2001   Dr Fuller Plan  . Hyperlipidemia     Past Surgical History:  Procedure Laterality Date  . COLONOSCOPY  2006 & 2011   negative, Dr Fuller Plan  . COLONOSCOPY W/ POLYPECTOMY  2001  . CYSTECTOMY     Ganglion cyst-left wrist   . HAND SURGERY  1983   Right thumb surgery   . ROTATOR CUFF REPAIR Right   . WISDOM TOOTH EXTRACTION      No Known Allergies  Family History  Problem Relation Age of Onset  . Colon cancer Father  63  . Diabetes Father        IDDM  . Stroke Paternal Grandfather        >55  . Stroke Paternal Grandmother        > 65  . Heart disease Neg Hx      Social History   Socioeconomic History  . Marital status: Married    Spouse name: Not on file  . Number of children: Not on file  . Years of education: Not on file  . Highest education level: Not on file  Occupational History  . Not on file  Social Needs  . Financial resource strain: Not on file  . Food insecurity:    Worry: Not on file    Inability: Not on file  . Transportation needs:    Medical: Not on file    Non-medical: Not on file  Tobacco Use  . Smoking status: Former Smoker    Packs/day: 1.50    Years: 37.00    Pack years: 55.50    Types: Cigarettes    Last attempt to quit: 12/20/2014    Years since quitting: 3.4  . Smokeless tobacco: Current User  . Tobacco comment: see Problem List, e-cig without nicotine  Substance and Sexual Activity  . Alcohol use: Yes    Alcohol/week: 2.0 standard drinks    Types: 2  Shots of liquor per week  . Drug use: No  . Sexual activity: Not on file  Lifestyle  . Physical activity:    Days per week: Not on file    Minutes per session: Not on file  . Stress: Not on file  Relationships  . Social connections:    Talks on phone: Not on file    Gets together: Not on file    Attends religious service: Not on file    Active member of club or organization: Not on file    Attends meetings of clubs or organizations: Not on file    Relationship status: Not on file  . Intimate partner violence:    Fear of current or ex partner: Not on file    Emotionally abused: Not on file    Physically abused: Not on file    Forced sexual activity: Not on file  Other Topics Concern  . Not on file  Social History Narrative  . Not on file     PHYSICAL EXAM:  VS: BP 138/74   Pulse 85   Temp 98.7 F (37.1 C) (Oral)   Ht 6\' 3"  (1.905 m)   Wt 288 lb 12.8 oz (131 kg)   SpO2 95%   BMI 36.10  kg/m  Physical Exam Gen: NAD, alert, cooperative with exam, well-appearing ENT: normal lips, normal nasal mucosa,  Eye: normal EOM, normal conjunctiva and lids CV:  no edema, +2 pedal pulses   Resp: no accessory muscle use, non-labored,  Skin: no rashes, no areas of induration  Neuro: normal tone, normal sensation to touch Psych:  normal insight, alert and oriented MSK:  Right and left foot: Cyst appears to be overlying the base of the fifth metatarsal on the right foot. Right foot it is more pes planus as opposed to the left foot. Has a bit of a Morton's foot bilaterally. Normal range of motion. Normal strength resistance. Neurovascular intact   Aspiration/Injection Procedure Note Clifford Newman 04/05/1961  Procedure: Aspiration Indications: right foot cyst and pain  Procedure Details Consent: Risks of procedure as well as the alternatives and risks of each were explained to the (patient/caregiver).  Consent for procedure obtained. Time Out: Verified patient identification, verified procedure, site/side was marked, verified correct patient position, special equipment/implants available, medications/allergies/relevent history reviewed, required imaging and test results available.  Performed.  The area was cleaned with iodine and alcohol swabs.    The right lateral midfoot cyst was injected using 3 cc's of 1% lidocaine with a 22 1 1/2" needle.  An 18-gauge inch and half needle was inserted and aspiration was achieved.  Ultrasound was used. Images were obtained in Long views showing the injection.    Amount of Fluid Aspirated: minimal amount Character of Fluid: gelatinous Fluid was sent for:n/a  A sterile dressing was applied.  Patient did tolerate procedure well.       ASSESSMENT & PLAN:   Pain in both feet It appears that he has a ganglion cyst on the lateral midfoot on the right foot.  Unclear if this is related to structural changes.  He does appear to have a  flatter foot on the right as opposed to the left.  Likely has a component of pes planus that is contributing to some of his pain. -Aspiration of the cyst on the right foot today. -Counseled on supportive care. -Can consider custom orthotics

## 2018-05-22 ENCOUNTER — Ambulatory Visit (INDEPENDENT_AMBULATORY_CARE_PROVIDER_SITE_OTHER): Payer: BC Managed Care – PPO | Admitting: Family Medicine

## 2018-05-22 DIAGNOSIS — M79671 Pain in right foot: Secondary | ICD-10-CM | POA: Diagnosis not present

## 2018-05-22 DIAGNOSIS — M79672 Pain in left foot: Secondary | ICD-10-CM

## 2018-05-22 NOTE — Progress Notes (Signed)
Clifford Newman - 58 y.o. male MRN [id number]  Date of birth: 1961/02/15  SUBJECTIVE:  Including CC & ROS.  No chief complaint on file.   Clifford Newman is a 58 y.o. male that is presenting with bilateral foot pain.  Is here to have custom orthotics made..    Review of Systems  HISTORY: Past Medical, Surgical, Social, and Family History Reviewed & Updated per EMR.   Pertinent Historical Findings include:  Past Medical History:  Diagnosis Date  . Colon polyp 2001   Dr Fuller Plan  . Hyperlipidemia     Past Surgical History:  Procedure Laterality Date  . COLONOSCOPY  2006 & 2011   negative, Dr Fuller Plan  . COLONOSCOPY W/ POLYPECTOMY  2001  . CYSTECTOMY     Ganglion cyst-left wrist   . HAND SURGERY  1983   Right thumb surgery   . ROTATOR CUFF REPAIR Right   . WISDOM TOOTH EXTRACTION      No Known Allergies  Family History  Problem Relation Age of Onset  . Colon cancer Father 56  . Diabetes Father        IDDM  . Stroke Paternal Grandfather        >55  . Stroke Paternal Grandmother        > 65  . Heart disease Neg Hx      Social History   Socioeconomic History  . Marital status: Married    Spouse name: Not on file  . Number of children: Not on file  . Years of education: Not on file  . Highest education level: Not on file  Occupational History  . Not on file  Social Needs  . Financial resource strain: Not on file  . Food insecurity:    Worry: Not on file    Inability: Not on file  . Transportation needs:    Medical: Not on file    Non-medical: Not on file  Tobacco Use  . Smoking status: Former Smoker    Packs/day: 1.50    Years: 37.00    Pack years: 55.50    Types: Cigarettes    Last attempt to quit: 12/20/2014    Years since quitting: 3.4  . Smokeless tobacco: Current User  . Tobacco comment: see Problem List, e-cig without nicotine  Substance and Sexual Activity  . Alcohol use: Yes    Alcohol/week: 2.0 standard drinks    Types: 2 Shots of liquor per  week  . Drug use: No  . Sexual activity: Not on file  Lifestyle  . Physical activity:    Days per week: Not on file    Minutes per session: Not on file  . Stress: Not on file  Relationships  . Social connections:    Talks on phone: Not on file    Gets together: Not on file    Attends religious service: Not on file    Active member of club or organization: Not on file    Attends meetings of clubs or organizations: Not on file    Relationship status: Not on file  . Intimate partner violence:    Fear of current or ex partner: Not on file    Emotionally abused: Not on file    Physically abused: Not on file    Forced sexual activity: Not on file  Other Topics Concern  . Not on file  Social History Narrative  . Not on file     PHYSICAL EXAM:  VS: There were no vitals  taken for this visit. Physical Exam Gen: NAD, alert, cooperative with exam, well-appearing       ASSESSMENT & PLAN:   Pain in both feet Used suede orthotics since wearing mostly in dress shoes while on duty.   Patient was fitted for a suede semi-rigid orthotic. The orthotic was heated and afterward the patient stood on the orthotic blank positioned on the orthotic stand. The patient was positioned in subtalar neutral position and 10 degrees of ankle dorsiflexion in a weight bearing stance. After completion of molding, a stable base was applied to the orthotic blank. The blank was ground to a stable position for weight bearing. Size: 12 Base: Blue EVA Additional Posting and Padding: None The patient ambulated these, and they were very comfortable.

## 2018-05-22 NOTE — Assessment & Plan Note (Signed)
Used suede orthotics since wearing mostly in dress shoes while on duty.   Patient was fitted for a suede semi-rigid orthotic. The orthotic was heated and afterward the patient stood on the orthotic blank positioned on the orthotic stand. The patient was positioned in subtalar neutral position and 10 degrees of ankle dorsiflexion in a weight bearing stance. After completion of molding, a stable base was applied to the orthotic blank. The blank was ground to a stable position for weight bearing. Size: 12 Base: Blue EVA Additional Posting and Padding: None The patient ambulated these, and they were very comfortable.

## 2018-06-08 ENCOUNTER — Other Ambulatory Visit: Payer: Self-pay | Admitting: Family Medicine

## 2018-06-08 DIAGNOSIS — M79671 Pain in right foot: Secondary | ICD-10-CM

## 2018-06-08 DIAGNOSIS — M79672 Pain in left foot: Principal | ICD-10-CM

## 2018-08-01 ENCOUNTER — Encounter: Payer: Self-pay | Admitting: Family Medicine

## 2018-08-13 ENCOUNTER — Encounter: Payer: Self-pay | Admitting: Family Medicine

## 2018-08-18 ENCOUNTER — Ambulatory Visit: Payer: BC Managed Care – PPO | Admitting: Family Medicine

## 2018-09-05 ENCOUNTER — Encounter: Payer: Self-pay | Admitting: Podiatry

## 2018-09-05 ENCOUNTER — Other Ambulatory Visit: Payer: Self-pay

## 2018-09-05 ENCOUNTER — Other Ambulatory Visit: Payer: Self-pay | Admitting: Podiatry

## 2018-09-05 ENCOUNTER — Ambulatory Visit (INDEPENDENT_AMBULATORY_CARE_PROVIDER_SITE_OTHER): Payer: BC Managed Care – PPO | Admitting: Podiatry

## 2018-09-05 ENCOUNTER — Ambulatory Visit (INDEPENDENT_AMBULATORY_CARE_PROVIDER_SITE_OTHER): Payer: BC Managed Care – PPO

## 2018-09-05 VITALS — Temp 97.7°F

## 2018-09-05 DIAGNOSIS — M79672 Pain in left foot: Secondary | ICD-10-CM | POA: Diagnosis not present

## 2018-09-05 DIAGNOSIS — M67471 Ganglion, right ankle and foot: Secondary | ICD-10-CM | POA: Diagnosis not present

## 2018-09-05 DIAGNOSIS — M779 Enthesopathy, unspecified: Secondary | ICD-10-CM | POA: Diagnosis not present

## 2018-09-05 DIAGNOSIS — M79671 Pain in right foot: Secondary | ICD-10-CM | POA: Diagnosis not present

## 2018-09-05 DIAGNOSIS — M7750 Other enthesopathy of unspecified foot: Secondary | ICD-10-CM

## 2018-09-05 NOTE — Progress Notes (Signed)
  Subjective:  Patient ID: Clifford Newman, male    DOB: 11/21/1960,  MRN: [id number]  Chief Complaint  Patient presents with  . Foot Pain    Bilateral; lateral side; pt stated, "Right foot is worse; I was told I had a cyst-Dr. Clearance Coots drained cyst in office in January 2020"; x8 months    58 y.o. male presents with the above complaint.   Review of Systems: Negative except as noted in the HPI. Denies N/V/F/Ch.  Past Medical History:  Diagnosis Date  . Colon polyp 2001   Dr Fuller Plan  . Hyperlipidemia     Current Outpatient Medications:  .  aspirin EC 81 MG tablet, Take 1 tablet (81 mg total) by mouth daily., Disp: 365 tablet, Rfl: 3 .  DU[place] 800-26.6 MG TABS, TAKE ONE EVERY 8 HOURS AS NEEDED FOR FEET PAIN WITH FOOD., Disp: 90 tablet, Rfl: 0 .  fluticasone (FLONASE) 50 MCG/ACT nasal spray, Place 2 sprays into both nostrils daily., Disp: 16 g, Rfl: 6 .  sildenafil (REVATIO) 20 MG tablet, Take two to five daily 45 minutes prior as needed., Disp: 50 tablet, Rfl: 1  Social History   Tobacco Use  Smoking Status Former Smoker  . Packs/day: 1.50  . Years: 37.00  . Pack years: 55.50  . Types: Cigarettes  . Last attempt to quit: 12/20/2014  . Years since quitting: 3.7  Smokeless Tobacco Current User  Tobacco Comment   see Problem List, e-cig without nicotine    No Known Allergies Objective:   Vitals:   09/05/18 0956  Temp: 97.7 F (36.5 C)   There is no height or weight on file to calculate BMI. Constitutional Well developed. Well nourished.  Vascular Dorsalis pedis pulses palpable bilaterally. Posterior tibial pulses palpable bilaterally. Capillary refill normal to all digits.  No cyanosis or clubbing noted. Pedal hair growth normal.  Neurologic Normal speech. Oriented to person, place, and time. Epicritic sensation to light touch grossly present bilaterally.  Dermatologic Nails well groomed and normal in appearance. No open wounds. No skin lesions.  Orthopedic:  POP R lateral foot with fluctuant palpable cyst   Radiographs: Taken and reviewed. Pes planus, hindfoot degenerative changes. Assessment:   1. Tendonitis of ankle or foot   2. Ganglion cyst of right foot    Plan:  Patient was evaluated and treated and all questions answered.  Ganglion cyst right ankle -XR as above. -Right foot ganglion aspirated as below -Discussed surgical excision should the cyst recur.  Procedure: Ganglion aspiration Location: Right lateral foot Skin Prep: Alcohol. Aspirate: mucinous fluid returned. Disposition: Patient tolerated procedure well. Injection site dressed with a band-aid.  Return in about 4 weeks (around 10/03/2018) for Ganglion cyst f/u .

## 2018-10-02 ENCOUNTER — Ambulatory Visit (INDEPENDENT_AMBULATORY_CARE_PROVIDER_SITE_OTHER): Payer: BC Managed Care – PPO | Admitting: Podiatry

## 2018-10-02 ENCOUNTER — Other Ambulatory Visit: Payer: Self-pay

## 2018-10-02 VITALS — Temp 97.3°F

## 2018-10-02 DIAGNOSIS — M7750 Other enthesopathy of unspecified foot: Secondary | ICD-10-CM

## 2018-10-02 DIAGNOSIS — E782 Mixed hyperlipidemia: Secondary | ICD-10-CM

## 2018-10-02 DIAGNOSIS — I2510 Atherosclerotic heart disease of native coronary artery without angina pectoris: Secondary | ICD-10-CM

## 2018-10-02 DIAGNOSIS — M67471 Ganglion, right ankle and foot: Secondary | ICD-10-CM | POA: Diagnosis not present

## 2018-10-02 NOTE — Progress Notes (Signed)
  Subjective:  Patient ID: Clifford Newman, male    DOB: Sep 25, 1960,  MRN: [id number]  Chief Complaint  Patient presents with  . Cyst    Pt states right lateral ganglion cyst site is still painful post aspiration, pain radiates plantar. Pt also states left ganglion is still painful on lateral aspect foot.    57 y.o. male presents with the above complaint.  History above confirmed with patient  Review of Systems: Negative except as noted in the HPI. Denies N/V/F/Ch.  Past Medical History:  Diagnosis Date  . Colon polyp 2001   Dr Fuller Plan  . Hyperlipidemia     Current Outpatient Medications:  .  aspirin EC 81 MG tablet, Take 1 tablet (81 mg total) by mouth daily., Disp: 365 tablet, Rfl: 3 .  DUEXIS 800-26.6 MG TABS, TAKE ONE EVERY 8 HOURS AS NEEDED FOR FEET PAIN WITH FOOD., Disp: 90 tablet, Rfl: 0 .  fluticasone (FLONASE) 50 MCG/ACT nasal spray, Place 2 sprays into both nostrils daily., Disp: 16 g, Rfl: 6 .  sildenafil (REVATIO) 20 MG tablet, Take two to five daily 45 minutes prior as needed., Disp: 50 tablet, Rfl: 1  Social History   Tobacco Use  Smoking Status Former Smoker  . Packs/day: 1.50  . Years: 37.00  . Pack years: 55.50  . Types: Cigarettes  . Last attempt to quit: 12/20/2014  . Years since quitting: 3.7  Smokeless Tobacco Current User  Tobacco Comment   see Problem List, e-cig without nicotine    No Known Allergies Objective:   Vitals:   10/02/18 0937  Temp: (!) 97.3 F (36.3 C)   There is no height or weight on file to calculate BMI. Constitutional Well developed. Well nourished.  Vascular Dorsalis pedis pulses palpable bilaterally. Posterior tibial pulses palpable bilaterally. Capillary refill normal to all digits.  No cyanosis or clubbing noted. Pedal hair growth normal.  Neurologic Normal speech. Oriented to person, place, and time. Epicritic sensation to light touch grossly present bilaterally.  Dermatologic Nails well groomed and normal in  appearance. No open wounds. No skin lesions.  Orthopedic: Pain palpation about the right lateral foot with fluctuant cyst noted about the fifth metatarsal area   Radiographs: None Assessment:   1. Ganglion cyst of right foot   2. Tendonitis of ankle or foot   3. ASCVD (arteriosclerotic cardiovascular disease)   4. Mixed hyperlipidemia    Plan:  Patient was evaluated and treated and all questions answered.  Ganglion cyst right ankle -Recurrence of the cyst noted.  Discussed patient due to recurrence he would benefit from surgical excision rather than repeated aspiration. -Discussed even with surgical intervention there is still a risk of ganglion cyst recurrence.  Patient verbalized understanding. -Patient has failed all conservative therapy and wishes to proceed with surgical intervention. All risks, benefits, and alternatives discussed with patient. No guarantees given. Consent reviewed and signed by patient. -Planned procedures: Excision ganglion cyst right foot -PDMP reviewed. -Risk factors: HLD, ASCVD. Patient to see PCP prior to surgery.   25 minutes of face to face time were spent with the patient. >50% of this was spent on counseling and coordination of care. Specifically discussed with patient the above diagnoses and overall treatment plan.   No follow-ups on file.

## 2018-10-03 ENCOUNTER — Telehealth: Payer: Self-pay | Admitting: Family Medicine

## 2018-10-03 NOTE — Telephone Encounter (Signed)
Pt dropped off surgery form for Dr Ethelene Hal, I put in his folder up front

## 2018-10-06 ENCOUNTER — Telehealth: Payer: Self-pay | Admitting: *Deleted

## 2018-10-06 NOTE — Telephone Encounter (Signed)
I attempted to schedule Mr. Roger's surgery at [facility].  I was told by Sharee Pimple that there wasn't any time available.  She said the case could not be scheduled because it's not an emergent case.

## 2018-10-06 NOTE — Telephone Encounter (Signed)
Can we get him scheduled for a f69f visit? We haven't seen him since December, so I can't use that info to fill out his form.  Thank you!

## 2018-10-06 NOTE — Telephone Encounter (Signed)
Can move to [facility]

## 2018-10-07 NOTE — Telephone Encounter (Signed)
Done

## 2018-10-08 NOTE — Telephone Encounter (Signed)
"  I'm calling you back.  You had said something about rescheduling my appointment.  I'm supposed to have surgery on Oct 15, 2018."  Yes, I left you the message.  They did not have any time available at [facility] on Oct 15, 2018.  Dr. March Rummage can do it on October 22, 2018 at [facility].  "Okay, is there any way I can get some information about this center?"  I'll send you there brochure in the mail.  "What time will I need to get there?"  Someone from the surgical center will give you a call a day or two prior to your surgery date and they will give you your arrival time.  Have you had a history and physical completed?  "I'm scheduled to go tomorrow."  [facility] does not require the history and physical form.  "Well I'll keep the appointment because it's too late to cancel it now, I'll be charged anyway.  If I have any health problems I'll let you know."

## 2018-10-08 NOTE — Telephone Encounter (Signed)
I left the patient a message that we need to reschedule his appointment that was tentatively scheduled for 10/15/2018.  I want to offer him 10/22/2018 at [facility] instead of [place].

## 2018-10-09 ENCOUNTER — Encounter: Payer: Self-pay | Admitting: Family Medicine

## 2018-10-09 ENCOUNTER — Ambulatory Visit (INDEPENDENT_AMBULATORY_CARE_PROVIDER_SITE_OTHER): Payer: BC Managed Care – PPO | Admitting: Family Medicine

## 2018-10-09 VITALS — BP 128/70 | HR 92 | Temp 97.9°F | Ht 75.0 in | Wt 300.5 lb

## 2018-10-09 DIAGNOSIS — Z01818 Encounter for other preprocedural examination: Secondary | ICD-10-CM | POA: Diagnosis not present

## 2018-10-09 DIAGNOSIS — E7800 Pure hypercholesterolemia, unspecified: Secondary | ICD-10-CM | POA: Diagnosis not present

## 2018-10-09 DIAGNOSIS — I2510 Atherosclerotic heart disease of native coronary artery without angina pectoris: Secondary | ICD-10-CM

## 2018-10-09 MED ORDER — ATORVASTATIN CALCIUM 20 MG PO TABS: 20.0000 mg | ORAL_TABLET | Freq: Every day | ORAL | 0 refills | Status: DC

## 2018-10-09 NOTE — Progress Notes (Signed)
Established Patient Office Visit  Subjective:  Patient ID: Clifford Newman, male    DOB: 27-Dec-1960  Age: 58 y.o. MRN: [id number]  CC:  Chief Complaint  Patient presents with  . Pre-op Exam    HPI [name] presents for follow-up of his elevated LDL cholesterol.  Patient problem statin but that led to muscle aches and pains and he discontinued it.  Patient does not have hypertension, diabetes and he quit smoking 2 years ago.  He is scheduled for an outpatient procedure to remove a ganglion cyst from his right foot.  He is having no chest pain shortness of breath or dyspnea on exertion.  Past Medical History:  Diagnosis Date  . Colon polyp 2001   Dr Fuller Plan  . Hyperlipidemia     Past Surgical History:  Procedure Laterality Date  . COLONOSCOPY  2006 & 2011   negative, Dr Fuller Plan  . COLONOSCOPY W/ POLYPECTOMY  2001  . CYSTECTOMY     Ganglion cyst-left wrist   . HAND SURGERY  1983   Right thumb surgery   . ROTATOR CUFF REPAIR Right   . WISDOM TOOTH EXTRACTION      Family History  Problem Relation Age of Onset  . Colon cancer Father 18  . Diabetes Father        IDDM  . Stroke Paternal Grandfather        >55  . Stroke Paternal Grandmother        > 65  . Heart disease Neg Hx     Social History   Socioeconomic History  . Marital status: Married    Spouse name: Not on file  . Number of children: Not on file  . Years of education: Not on file  . Highest education level: Not on file  Occupational History  . Not on file  Social Needs  . Financial resource strain: Not on file  . Food insecurity:    Worry: Not on file    Inability: Not on file  . Transportation needs:    Medical: Not on file    Non-medical: Not on file  Tobacco Use  . Smoking status: Former Smoker    Packs/day: 1.50    Years: 37.00    Pack years: 55.50    Types: Cigarettes    Last attempt to quit: 12/20/2014    Years since quitting: 3.8  . Smokeless tobacco: Current User  . Tobacco comment:  see Problem List, e-cig without nicotine  Substance and Sexual Activity  . Alcohol use: Yes    Alcohol/week: 2.0 standard drinks    Types: 2 Shots of liquor per week  . Drug use: No  . Sexual activity: Not on file  Lifestyle  . Physical activity:    Days per week: Not on file    Minutes per session: Not on file  . Stress: Not on file  Relationships  . Social connections:    Talks on phone: Not on file    Gets together: Not on file    Attends religious service: Not on file    Active member of club or organization: Not on file    Attends meetings of clubs or organizations: Not on file    Relationship status: Not on file  . Intimate partner violence:    Fear of current or ex partner: Not on file    Emotionally abused: Not on file    Physically abused: Not on file    Forced sexual activity: Not on  file  Other Topics Concern  . Not on file  Social History Narrative  . Not on file    Outpatient Medications Prior to Visit  Medication Sig Dispense Refill  . aspirin EC 81 MG tablet Take 1 tablet (81 mg total) by mouth daily. 365 tablet 3  . DUEXIS 800-26.6 MG TABS TAKE ONE EVERY 8 HOURS AS NEEDED FOR FEET PAIN WITH FOOD. 90 tablet 0  . fluticasone (FLONASE) 50 MCG/ACT nasal spray Place 2 sprays into both nostrils daily. 16 g 6  . sildenafil (REVATIO) 20 MG tablet Take two to five daily 45 minutes prior as needed. 50 tablet 1   No facility-administered medications prior to visit.     No Known Allergies  ROS Review of Systems  Constitutional: Negative.   HENT: Negative.   Respiratory: Negative for chest tightness, shortness of breath and wheezing.   Cardiovascular: Negative for chest pain, palpitations and leg swelling.  Gastrointestinal: Negative.   Genitourinary: Negative.   Musculoskeletal: Negative for gait problem and joint swelling.  Neurological: Negative for headaches.  Hematological: Negative.   Psychiatric/Behavioral: Negative.       Objective:    Physical  Exam  Constitutional: He is oriented to person, place, and time. He appears well-developed and well-nourished. No distress.  HENT:  Head: Normocephalic and atraumatic.  Right Ear: External ear normal.  Left Ear: External ear normal.  Mouth/Throat: Oropharynx is clear and moist. No oropharyngeal exudate.  Eyes: Pupils are equal, round, and reactive to light. Conjunctivae are normal. Right eye exhibits no discharge. Left eye exhibits no discharge. No scleral icterus.  Neck: Neck supple. No JVD present. No tracheal deviation present. No thyromegaly present.  Cardiovascular: Normal rate, regular rhythm and normal heart sounds.  Pulmonary/Chest: Effort normal and breath sounds normal. No stridor. No respiratory distress. He has no wheezes. He has no rales.  Abdominal: Bowel sounds are normal.  Musculoskeletal:        General: No edema.  Lymphadenopathy:    He has no cervical adenopathy.  Neurological: He is alert and oriented to person, place, and time.  Skin: Skin is warm and dry. He is not diaphoretic.  Psychiatric: He has a normal mood and affect. His behavior is normal.    BP 128/70   Pulse 92   Temp 97.9 F (36.6 C) (Oral)   Ht 6\' 3"  (1.905 m)   Wt (!) 300 lb 8 oz (136.3 kg)   SpO2 96%   BMI 37.56 kg/m  Wt Readings from Last 3 Encounters:  10/09/18 (!) 300 lb 8 oz (136.3 kg)  05/20/18 288 lb 12.8 oz (131 kg)  05/19/18 286 lb 6 oz (129.9 kg)   BP Readings from Last 3 Encounters:  10/09/18 128/70  05/20/18 138/74  05/19/18 138/80   Guideline developer:  UpToDate (see UpToDate for funding source) Date Released: June 2014  There are no preventive care reminders to display for this patient.  There are no preventive care reminders to display for this patient.  Lab Results  Component Value Date   TSH 1.85 08/12/2017   Lab Results  Component Value Date   WBC 3.1 (L) 05/19/2018   HGB 14.9 05/19/2018   HCT 44.5 05/19/2018   MCV 88.9 05/19/2018   PLT 183.0 05/19/2018    Lab Results  Component Value Date   NA 137 05/19/2018   K 4.1 05/19/2018   CO2 25 05/19/2018   GLUCOSE 102 (H) 05/19/2018   BUN 11 05/19/2018   CREATININE 1.01 05/19/2018  BILITOT 0.5 05/19/2018   ALKPHOS 40 05/19/2018   AST 13 05/19/2018   ALT 16 05/19/2018   PROT 6.5 05/19/2018   ALBUMIN 4.0 05/19/2018   CALCIUM 9.2 05/19/2018   GFR 97.02 05/19/2018   Lab Results  Component Value Date   CHOL 246 (H) 05/19/2018   Lab Results  Component Value Date   HDL 62.60 05/19/2018   Lab Results  Component Value Date   LDLCALC 171 (H) 05/19/2018   Lab Results  Component Value Date   TRIG 63.0 05/19/2018   Lab Results  Component Value Date   CHOLHDL 4 05/19/2018   Lab Results  Component Value Date   HGBA1C 6.2 11/15/2017      Assessment & Plan:   Problem List Items Addressed This Visit      Cardiovascular and Mediastinum   Coronary artery calcification seen on CAT scan   Relevant Medications   atorvastatin (LIPITOR) 20 MG tablet     Other   Elevated LDL cholesterol level - Primary   Relevant Medications   atorvastatin (LIPITOR) 20 MG tablet      Meds ordered this encounter  Medications  . atorvastatin (LIPITOR) 20 MG tablet    Sig: Take 1 tablet (20 mg total) by mouth daily.    Dispense:  90 tablet    Refill:  0    Follow-up: Return in about 3 months (around 01/09/2019).   Stressed the importance of statin therapy for his elevated LDL cholesterol and coronary artery calcification by CT scan.  Congratulated him on quitting smoking.  Stressed the importance of follow-up in 3 months to recheck his cholesterol.  Cleared him from for his low risk.  Ganglion cyst removal surgery.

## 2018-10-09 NOTE — Patient Instructions (Signed)
High Cholesterol  High cholesterol is a condition in which the blood has high levels of a white, waxy, fat-like substance (cholesterol). The human body needs small amounts of cholesterol. The liver makes all the cholesterol that the body needs. Extra (excess) cholesterol comes from the food that we eat. Cholesterol is carried from the liver by the blood through the blood vessels. If you have high cholesterol, deposits (plaques) may build up on the walls of your blood vessels (arteries). Plaques make the arteries narrower and stiffer. Cholesterol plaques increase your risk for heart attack and stroke. Work with your health care provider to keep your cholesterol levels in a healthy range. What increases the risk? This condition is more likely to develop in people who:  Eat foods that are high in animal fat (saturated fat) or cholesterol.  Are overweight.  Are not getting enough exercise.  Have a family history of high cholesterol. What are the signs or symptoms? There are no symptoms of this condition. How is this diagnosed? This condition may be diagnosed from the results of a blood test.  If you are older than age 20, your health care provider may check your cholesterol every 4-6 years.  You may be checked more often if you already have high cholesterol or other risk factors for heart disease. The blood test for cholesterol measures:  "Bad" cholesterol (LDL cholesterol). This is the main type of cholesterol that causes heart disease. The desired level for LDL is less than 100.  "Good" cholesterol (HDL cholesterol). This type helps to protect against heart disease by cleaning the arteries and carrying the LDL away. The desired level for HDL is 60 or higher.  Triglycerides. These are fats that the body can store or burn for energy. The desired number for triglycerides is lower than 150.  Total cholesterol. This is a measure of the total amount of cholesterol in your blood, including LDL  cholesterol, HDL cholesterol, and triglycerides. A healthy number is less than 200. How is this treated? This condition is treated with diet changes, lifestyle changes, and medicines. Diet changes  This may include eating more whole grains, fruits, vegetables, nuts, and fish.  This may also include cutting back on red meat and foods that have a lot of added sugar. Lifestyle changes  Changes may include getting at least 40 minutes of aerobic exercise 3 times a week. Aerobic exercises include walking, biking, and swimming. Aerobic exercise along with a healthy diet can help you maintain a healthy weight.  Changes may also include quitting smoking. Medicines  Medicines are usually given if diet and lifestyle changes have failed to reduce your cholesterol to healthy levels.  Your health care provider may prescribe a statin medicine. Statin medicines have been shown to reduce cholesterol, which can reduce the risk of heart disease. Follow these instructions at home: Eating and drinking If told by your health care provider:  Eat chicken (without skin), fish, veal, shellfish, ground turkey breast, and round or loin cuts of red meat.  Do not eat fried foods or fatty meats, such as hot dogs and salami.  Eat plenty of fruits, such as apples.  Eat plenty of vegetables, such as broccoli, potatoes, and carrots.  Eat beans, peas, and lentils.  Eat grains such as barley, rice, couscous, and bulgur wheat.  Eat pasta without cream sauces.  Use skim or nonfat milk, and eat low-fat or nonfat yogurt and cheeses.  Do not eat or drink whole milk, cream, ice cream, egg yolks,   or hard cheeses.  Do not eat stick margarine or tub margarines that contain trans fats (also called partially hydrogenated oils).  Do not eat saturated tropical oils, such as coconut oil and palm oil.  Do not eat cakes, cookies, crackers, or other baked goods that contain trans fats.  General instructions  Exercise as  directed by your health care provider. Increase your activity level with activities such as gardening, walking, and taking the stairs.  Take over-the-counter and prescription medicines only as told by your health care provider.  Do not use any products that contain nicotine or tobacco, such as cigarettes and e-cigarettes. If you need help quitting, ask your health care provider.  Keep all follow-up visits as told by your health care provider. This is important. Contact a health care provider if:  You are struggling to maintain a healthy diet or weight.  You need help to start on an exercise program.  You need help to stop smoking. Get help right away if:  You have chest pain.  You have trouble breathing. This information is not intended to replace advice given to you by your health care provider. Make sure you discuss any questions you have with your health care provider. Document Released: 05/07/2005 Document Revised: 12/03/2015 Document Reviewed: 11/05/2015 Elsevier Interactive Patient Education  2019 Elsevier Inc. Atorvastatin tablets What is this medicine? ATORVASTATIN (a TORE va sta tin) is known as a HMG-CoA reductase inhibitor or 'statin'. It lowers the level of cholesterol and triglycerides in the blood. This drug may also reduce the risk of heart attack, stroke, or other health problems in patients with risk factors for heart disease. Diet and lifestyle changes are often used with this drug. This medicine may be used for other purposes; ask your health care provider or pharmacist if you have questions. COMMON BRAND NAME(S): Lipitor What should I tell my health care provider before I take this medicine? They need to know if you have any of these conditions: -diabetes -if you often drink alcohol -history of stroke -kidney disease -liver disease -muscle aches or weakness -thyroid disease -an unusual or allergic reaction to atorvastatin, other medicines, foods, dyes, or  preservatives -pregnant or trying to get pregnant -breast-feeding How should I use this medicine? Take this medicine by mouth with a glass of water. Follow the directions on the prescription label. You can take it with or without food. If it upsets your stomach, take it with food. Do not take with grapefruit juice. Take your medicine at regular intervals. Do not take it more often than directed. Do not stop taking except on your doctor's advice. Talk to your pediatrician regarding the use of this medicine in children. While this drug may be prescribed for children as young as 10 for selected conditions, precautions do apply. Overdosage: If you think you have taken too much of this medicine contact a poison control center or emergency room at once. NOTE: This medicine is only for you. Do not share this medicine with others. What if I miss a dose? If you miss a dose, take it as soon as you can. If your next dose is to be taken in less than 12 hours, then do not take the missed dose. Take the next dose at your regular time. Do not take double or extra doses. What may interact with this medicine? Do not take this medicine with any of the following medications: -dasabuvir; ombitasvir; paritaprevir; ritonavir -ombitasvir; paritaprevir; ritonavir -posaconazole -red yeast rice This medicine may also interact  with the following medications: -alcohol -birth control pills -certain antibiotics like erythromycin and clarithromycin -certain antivirals for HIV or hepatitis -certain medicines for cholesterol like fenofibrate, gemfibrozil, and niacin -certain medicines for fungal infections like ketoconazole and itraconazole -colchicine -cyclosporine -digoxin -grapefruit juice -rifampin This list may not describe all possible interactions. Give your health care provider a list of all the medicines, herbs, non-prescription drugs, or dietary supplements you use. Also tell them if you smoke, drink alcohol,  or use illegal drugs. Some items may interact with your medicine. What should I watch for while using this medicine? Visit your doctor or health care professional for regular check-ups. You may need regular tests to make sure your liver is working properly. Your health care professional may tell you to stop taking this medicine if you develop muscle problems. If your muscle problems do not go away after stopping this medicine, contact your health care professional. Do not become pregnant while taking this medicine. Women should inform their health care professional if they wish to become pregnant or think they might be pregnant. There is a potential for serious side effects to an unborn child. Talk to your health care professional or pharmacist for more information. Do not breast-feed an infant while taking this medicine. This medicine may affect blood sugar levels. If you have diabetes, check with your doctor or health care professional before you change your diet or the dose of your diabetic medicine. If you are going to need surgery or other procedure, tell your doctor that you are using this medicine. This drug is only part of a total heart-health program. Your doctor or a dietician can suggest a low-cholesterol and low-fat diet to help. Avoid alcohol and smoking, and keep a proper exercise schedule. This medicine may cause a decrease in Co-Enzyme Q-10. You should make sure that you get enough Co-Enzyme Q-10 while you are taking this medicine. Discuss the foods you eat and the vitamins you take with your health care professional. What side effects may I notice from receiving this medicine? Side effects that you should report to your doctor or health care professional as soon as possible: -allergic reactions like skin rash, itching or hives, swelling of the face, lips, or tongue -fever -joint pain -loss of memory -redness, blistering, peeling or loosening of the skin, including inside the mouth  -signs and symptoms of liver injury like dark yellow or brown urine; general ill feeling or flu-like symptoms; light-belly pain; unusually weak or tired; yellowing of the eyes or skin -signs and symptoms of muscle injury like dark urine; trouble passing urine or change in the amount of urine; unusually weak or tired; muscle pain or side or back pain Side effects that usually do not require medical attention (report to your doctor or health care professional if they continue or are bothersome): -diarrhea -nausea -stomach pain -trouble sleeping -upset stomach This list may not describe all possible side effects. Call your doctor for medical advice about side effects. You may report side effects to FDA at 1-800-FDA-1088. Where should I keep my medicine? Keep out of the reach of children. Store between 20 and 25 degrees C (68 and 77 degrees F). Throw away any unused medicine after the expiration date. NOTE: This sheet is a summary. It may not cover all possible information. If you have questions about this medicine, talk to your doctor, pharmacist, or health care provider.  2019 Elsevier/Gold Standard (2017-09-06 11:36:48)

## 2018-10-10 ENCOUNTER — Telehealth: Payer: Self-pay | Admitting: *Deleted

## 2018-10-10 NOTE — Telephone Encounter (Signed)
DOS 10/22/2018, CPT CODE: 12820 - EXC. GANGLION/TUMOR RT  UHC: Effective Date - 06/21/2018  Notification or Prior Authorization is not required for the requested services  In response to the COVID-19 national emergency, Site of Service prior authorization requirements have been suspended for this code(s) through 10/19/2018.  Decision ID #:[id number] ------------------------------------------------------------------------------------------------------------------------------------------------------------ BCBS: Policy Effective : 74/71/8550 - 05/20/9998   In-Network    Max Per Benefit Period Year-to-Date Remaining  CoInsurance  20%   Deductible  $1250.00 $1250.00  Out-Of-Pocket  $4890.00 $4730.00   AMBULATORY SURGERY  In Network  Copay Coinsurance  Not Applicable  15% per [place]

## 2018-10-14 ENCOUNTER — Telehealth: Payer: Self-pay | Admitting: Podiatry

## 2018-10-15 NOTE — Telephone Encounter (Signed)
Left voicemail letting pt know we have him scheduled for next Wednesday, 03 June at [facility] and that someone from there would call him 24-48 hours before to let him know what time to arrive. Told pt to call us back with any other questions.

## 2018-10-15 NOTE — Telephone Encounter (Signed)
I'm calling to clarify when the surgery is actually scheduled for.

## 2018-10-22 ENCOUNTER — Other Ambulatory Visit: Payer: Self-pay | Admitting: Podiatry

## 2018-10-22 DIAGNOSIS — M67471 Ganglion, right ankle and foot: Secondary | ICD-10-CM

## 2018-10-22 DIAGNOSIS — M722 Plantar fascial fibromatosis: Secondary | ICD-10-CM

## 2018-10-22 MED ORDER — HYDROCODONE-ACETAMINOPHEN 10-325 MG PO TABS: 1.0000 | ORAL_TABLET | ORAL | 0 refills | Status: DC | PRN

## 2018-10-22 MED ORDER — CEPHALEXIN 500 MG PO CAPS: 500.0000 mg | ORAL_CAPSULE | Freq: Two times a day (BID) | ORAL | 0 refills | Status: DC

## 2018-10-22 NOTE — Progress Notes (Signed)
Rx sent to pharmacy for outpatient surgery. °

## 2018-10-23 ENCOUNTER — Telehealth: Payer: Self-pay

## 2018-10-23 ENCOUNTER — Encounter: Payer: Self-pay | Admitting: Podiatry

## 2018-10-23 NOTE — Telephone Encounter (Signed)
POST OP CALL-    1) General condition stated by the patient:   2) Is the pt having pain?   3) Pain score:   4) Has the pt taken Rx'd pain medication, regularly or PRN?   5) Is the pain medication giving relief?  6) Any fever, chills, nausea, or vomiting, shortness of breath or tightness in calf?  7) Is the bandage clean, dry and intact?  8) Is there excessive tightness, bleeding or drainage coming through the bandage?  9) Did you understand all of the post op instruction sheet given?  10) Any questions or concerns regarding post op care/recovery?    Confirmed POV appointment with patient   Pt did not answer phone, left message stating call was for Har[place] and that if he had any problems or questions to please call [facility] [place].

## 2018-10-24 ENCOUNTER — Other Ambulatory Visit: Payer: Self-pay | Admitting: Acute Care

## 2018-10-24 DIAGNOSIS — Z122 Encounter for screening for malignant neoplasm of respiratory organs: Secondary | ICD-10-CM

## 2018-10-24 DIAGNOSIS — F17210 Nicotine dependence, cigarettes, uncomplicated: Secondary | ICD-10-CM

## 2018-10-31 ENCOUNTER — Ambulatory Visit (INDEPENDENT_AMBULATORY_CARE_PROVIDER_SITE_OTHER): Payer: Self-pay | Admitting: Podiatry

## 2018-10-31 ENCOUNTER — Encounter: Payer: Self-pay | Admitting: Podiatry

## 2018-10-31 ENCOUNTER — Other Ambulatory Visit: Payer: Self-pay

## 2018-10-31 VITALS — Temp 98.4°F

## 2018-10-31 DIAGNOSIS — M67471 Ganglion, right ankle and foot: Secondary | ICD-10-CM

## 2018-10-31 DIAGNOSIS — Z9889 Other specified postprocedural states: Secondary | ICD-10-CM

## 2018-10-31 MED ORDER — HYDROCODONE-ACETAMINOPHEN 5-325 MG PO TABS: 1.0000 | ORAL_TABLET | Freq: Four times a day (QID) | ORAL | 0 refills | Status: DC | PRN

## 2018-11-06 ENCOUNTER — Other Ambulatory Visit: Payer: Self-pay

## 2018-11-06 ENCOUNTER — Encounter: Payer: Self-pay | Admitting: Podiatry

## 2018-11-06 ENCOUNTER — Ambulatory Visit: Payer: BC Managed Care – PPO

## 2018-11-06 DIAGNOSIS — Z9889 Other specified postprocedural states: Secondary | ICD-10-CM

## 2018-11-06 DIAGNOSIS — M67471 Ganglion, right ankle and foot: Secondary | ICD-10-CM

## 2018-11-06 NOTE — Progress Notes (Signed)
  Subjective:  Patient ID: Clifford Newman, male    DOB: 07/28/1960,  MRN: [id number]  Chief Complaint  Patient presents with  . Routine Post Op    " my foot is throbbing, and is very painful"     DOS: 10/22/2018 Procedure: Removal of ganglion Right foot  58 y.o. male returns for post-op check. Hx as above. Denies post-op isues. Requests refill of pain rx.  Review of Systems: Negative except as noted in the HPI. Denies N/V/F/Ch.  Past Medical History:  Diagnosis Date  . Colon polyp 2001   Dr Fuller Plan  . Hyperlipidemia     Current Outpatient Medications:  .  aspirin EC 81 MG tablet, Take 1 tablet (81 mg total) by mouth daily., Disp: 365 tablet, Rfl: 3 .  atorvastatin (LIPITOR) 20 MG tablet, Take 1 tablet (20 mg total) by mouth daily., Disp: 90 tablet, Rfl: 0 .  cephALEXin (KEFLEX) 500 MG capsule, Take 1 capsule (500 mg total) by mouth 2 (two) times daily., Disp: 14 capsule, Rfl: 0 .  DUEXIS 800-26.6 MG TABS, TAKE ONE EVERY 8 HOURS AS NEEDED FOR FEET PAIN WITH FOOD., Disp: 90 tablet, Rfl: 0 .  fluticasone (FLONASE) 50 MCG/ACT nasal spray, Place 2 sprays into both nostrils daily., Disp: 16 g, Rfl: 6 .  HYDROcodone-acetaminophen (NORCO) 5-325 MG tablet, Take 1 tablet by mouth every 6 (six) hours as needed for moderate pain., Disp: 20 tablet, Rfl: 0 .  sildenafil (REVATIO) 20 MG tablet, Take two to five daily 45 minutes prior as needed., Disp: 50 tablet, Rfl: 1  Social History   Tobacco Use  Smoking Status Former Smoker  . Packs/day: 1.50  . Years: 37.00  . Pack years: 55.50  . Types: Cigarettes  . Quit date: 12/20/2014  . Years since quitting: 3.8  Smokeless Tobacco Current User  Tobacco Comment   see Problem List, e-cig without nicotine    No Known Allergies Objective:   Vitals:   10/31/18 1106  Temp: 98.4 F (36.9 C)   There is no height or weight on file to calculate BMI. Constitutional Well developed. Well nourished.  Vascular Foot warm and well perfused. Capillary  refill normal to all digits.   Neurologic Normal speech. Oriented to person, place, and time. Epicritic sensation to light touch grossly present bilaterally.  Dermatologic Skin healing well without signs of infection. Skin edges well coapted without signs of infection.  Orthopedic: Tenderness to palpation noted about the surgical site.   Radiographs: None Assessment:   1. Post-operative state   2. Ganglion cyst of right foot    Plan:  Patient was evaluated and treated and all questions answered.  S/p foot surgery right -Progressing as expected post-operatively. -XR: None -WB Status: WBAT in surgical shoe -Sutures: intact. Leave intact one week.. -Medications: Refill pain rx. -Foot redressed.  No follow-ups on file.

## 2018-11-11 NOTE — Progress Notes (Signed)
Patient is here today for follow-up appointment, recent procedure performed on 10/22/2018: Right foot excision of ganglion cyst.  He states that his foot feels really good, his pain has improved since last visit and is swelling has reduced.  Right foot lateral incision: The area appears to be healing well, minimal swelling, no erythema, no redness, all sutures are intact, wound edges coapted.  Sutures were removed, and Steri-Strips were placed over the incision.  Advised patient that he could begin to get his foot wet, he is to remain in his surgical shoe, report any signs and symptoms of infection or DVT and follow-up in 2 weeks or sooner if needed.

## 2018-11-12 ENCOUNTER — Telehealth (INDEPENDENT_AMBULATORY_CARE_PROVIDER_SITE_OTHER): Payer: BC Managed Care – PPO | Admitting: Family Medicine

## 2018-11-12 ENCOUNTER — Encounter: Payer: Self-pay | Admitting: Family Medicine

## 2018-11-12 DIAGNOSIS — I2510 Atherosclerotic heart disease of native coronary artery without angina pectoris: Secondary | ICD-10-CM | POA: Diagnosis not present

## 2018-11-12 DIAGNOSIS — E7800 Pure hypercholesterolemia, unspecified: Secondary | ICD-10-CM | POA: Diagnosis not present

## 2018-11-12 MED ORDER — ATORVASTATIN CALCIUM 20 MG PO TABS: 20.0000 mg | ORAL_TABLET | Freq: Every day | ORAL | 0 refills | Status: DC

## 2018-11-12 NOTE — Progress Notes (Signed)
Established Patient Office Visit  Subjective:  Patient ID: Clifford Newman, male    DOB: 1961-03-12  Age: 58 y.o. MRN: [id number]  CC:  Chief Complaint  Patient presents with  . Follow-up    HPI DONSHAY LUPINSKI presents for follow-up of his elevated LDL cholesterol.  Blood sugars have been mildly elevated hemoglobin A1c was 6.2.  He quit smoking smoking.  Blood pressure has been normal.  HDL levels are favorable.  He has coronary artery calcifications by serial CT scans done for lung cancer screening.  He is having no chest pain or shortness of breath.  He is recovering from ganglion cyst surgery on his right foot.  Apparently something happened with the prescription for Lipitor that I have sent on 21 May the patient never received the prescription  Past Medical History:  Diagnosis Date  . Colon polyp 2001   Dr Fuller Plan  . Hyperlipidemia     Past Surgical History:  Procedure Laterality Date  . COLONOSCOPY  2006 & 2011   negative, Dr Fuller Plan  . COLONOSCOPY W/ POLYPECTOMY  2001  . CYSTECTOMY     Ganglion cyst-left wrist   . HAND SURGERY  1983   Right thumb surgery   . ROTATOR CUFF REPAIR Right   . WISDOM TOOTH EXTRACTION      Family History  Problem Relation Age of Onset  . Colon cancer Father 43  . Diabetes Father        IDDM  . Stroke Paternal Grandfather        >55  . Stroke Paternal Grandmother        > 65  . Heart disease Neg Hx     Social History   Socioeconomic History  . Marital status: Married    Spouse name: Not on file  . Number of children: Not on file  . Years of education: Not on file  . Highest education level: Not on file  Occupational History  . Not on file  Social Needs  . Financial resource strain: Not on file  . Food insecurity    Worry: Not on file    Inability: Not on file  . Transportation needs    Medical: Not on file    Non-medical: Not on file  Tobacco Use  . Smoking status: Former Smoker    Packs/day: 1.50    Years: 37.00   Pack years: 55.50    Types: Cigarettes    Quit date: 12/20/2014    Years since quitting: 3.8  . Smokeless tobacco: Current User  . Tobacco comment: see Problem List, e-cig without nicotine  Substance and Sexual Activity  . Alcohol use: Yes    Alcohol/week: 2.0 standard drinks    Types: 2 Shots of liquor per week  . Drug use: No  . Sexual activity: Not on file  Lifestyle  . Physical activity    Days per week: Not on file    Minutes per session: Not on file  . Stress: Not on file  Relationships  . Social Herbalist on phone: Not on file    Gets together: Not on file    Attends religious service: Not on file    Active member of club or organization: Not on file    Attends meetings of clubs or organizations: Not on file    Relationship status: Not on file  . Intimate partner violence    Fear of current or ex partner: Not on file  Emotionally abused: Not on file    Physically abused: Not on file    Forced sexual activity: Not on file  Other Topics Concern  . Not on file  Social History Narrative  . Not on file    Outpatient Medications Prior to Visit  Medication Sig Dispense Refill  . aspirin EC 81 MG tablet Take 1 tablet (81 mg total) by mouth daily. 365 tablet 3  . cephALEXin (KEFLEX) 500 MG capsule Take 1 capsule (500 mg total) by mouth 2 (two) times daily. 14 capsule 0  . DUEXIS 800-26.6 MG TABS TAKE ONE EVERY 8 HOURS AS NEEDED FOR FEET PAIN WITH FOOD. 90 tablet 0  . fluticasone (FLONASE) 50 MCG/ACT nasal spray Place 2 sprays into both nostrils daily. 16 g 6  . HYDROcodone-acetaminophen (NORCO) 5-325 MG tablet Take 1 tablet by mouth every 6 (six) hours as needed for moderate pain. 20 tablet 0  . sildenafil (REVATIO) 20 MG tablet Take two to five daily 45 minutes prior as needed. 50 tablet 1  . atorvastatin (LIPITOR) 20 MG tablet Take 1 tablet (20 mg total) by mouth daily. 90 tablet 0   No facility-administered medications prior to visit.     No Known  Allergies  ROS Review of Systems  Constitutional: Negative.   Eyes: Negative for photophobia and visual disturbance.  Respiratory: Negative.   Cardiovascular: Negative.   Gastrointestinal: Negative.   Neurological: Negative for speech difficulty, numbness and headaches.  Hematological: Negative.   Psychiatric/Behavioral: Negative.       Objective:    Physical Exam  Constitutional: He is oriented to person, place, and time. He appears well-developed and well-nourished. No distress.  HENT:  Head: Normocephalic and atraumatic.  Right Ear: External ear normal.  Left Ear: External ear normal.  Eyes: Conjunctivae are normal. Right eye exhibits no discharge. Left eye exhibits no discharge. No scleral icterus.  Neck: No JVD present. No tracheal deviation present.  Pulmonary/Chest: Effort normal. No stridor.  Neurological: He is alert and oriented to person, place, and time.  Skin: He is not diaphoretic.  Psychiatric: He has a normal mood and affect. His behavior is normal.    There were no vitals taken for this visit. Wt Readings from Last 3 Encounters:  10/09/18 (!) 300 lb 8 oz (136.3 kg)  05/20/18 288 lb 12.8 oz (131 kg)  05/19/18 286 lb 6 oz (129.9 kg)   BP Readings from Last 3 Encounters:  10/09/18 128/70  05/20/18 138/74  05/19/18 138/80   Guideline developer:  UpToDate (see UpToDate for funding source) Date Released: June 2014  There are no preventive care reminders to display for this patient.  There are no preventive care reminders to display for this patient.  Lab Results  Component Value Date   TSH 1.85 08/12/2017   Lab Results  Component Value Date   WBC 3.1 (L) 05/19/2018   HGB 14.9 05/19/2018   HCT 44.5 05/19/2018   MCV 88.9 05/19/2018   PLT 183.0 05/19/2018   Lab Results  Component Value Date   NA 137 05/19/2018   K 4.1 05/19/2018   CO2 25 05/19/2018   GLUCOSE 102 (H) 05/19/2018   BUN 11 05/19/2018   CREATININE 1.01 05/19/2018   BILITOT 0.5  05/19/2018   ALKPHOS 40 05/19/2018   AST 13 05/19/2018   ALT 16 05/19/2018   PROT 6.5 05/19/2018   ALBUMIN 4.0 05/19/2018   CALCIUM 9.2 05/19/2018   GFR 97.02 05/19/2018   Lab Results  Component Value  Date   CHOL 246 (H) 05/19/2018   Lab Results  Component Value Date   HDL 62.60 05/19/2018   Lab Results  Component Value Date   LDLCALC 171 (H) 05/19/2018   Lab Results  Component Value Date   TRIG 63.0 05/19/2018   Lab Results  Component Value Date   CHOLHDL 4 05/19/2018   Lab Results  Component Value Date   HGBA1C 6.2 11/15/2017   The 10-year ASCVD risk score Mikey Bussing DC Jr., et al., 2013) is: 8.2%   Values used to calculate the score:     Age: 66 years     Sex: Male     Is Non-Hispanic African American: Yes     Diabetic: No     Tobacco smoker: No     Systolic Blood Pressure: 834 mmHg     Is BP treated: No     HDL Cholesterol: 62.6 mg/dL     Total Cholesterol: 246 mg/dL   Assessment & Plan:   Problem List Items Addressed This Visit      Cardiovascular and Mediastinum   Coronary artery calcification seen on CAT scan   Relevant Medications   atorvastatin (LIPITOR) 20 MG tablet     Other   Elevated LDL cholesterol level   Relevant Medications   atorvastatin (LIPITOR) 20 MG tablet      Meds ordered this encounter  Medications  . atorvastatin (LIPITOR) 20 MG tablet    Sig: Take 1 tablet (20 mg total) by mouth daily.    Dispense:  90 tablet    Refill:  0    Follow-up: No follow-ups on file.   Virtual Visit via Video Note  I connected with Arlester Marker on 11/12/18 at  8:00 AM EDT by a video enabled telemedicine application and verified that I am speaking with the correct person using two identifiers.  Location: Patient: work Provider:    I discussed the limitations of evaluation and management by telemedicine and the availability of in person appointments. The patient expressed understanding and agreed to proceed.  History of Present Illness:     Observations/Objective:   Assessment and Plan:   Follow Up Instructions:    I discussed the assessment and treatment plan with the patient. The patient was provided an opportunity to ask questions and all were answered. The patient agreed with the plan and demonstrated an understanding of the instructions.   The patient was advised to call back or seek an in-person evaluation if the symptoms worsen or if the condition fails to improve as anticipated.  I provided 20 minutes of non-face-to-face time during this encounter.   Libby Maw, MD  While patient's 10-year risk score is mildly elevated he has documented coronary artery calcifications by CT scanning.  Believe strongly that I could prolong his life and its quality with statin therapy.  He goes later this month for serial CT scanning.  Encouraged him to exercise when he is able him to try to lose some weight in order that his hemoglobin A1c may reduce.

## 2018-11-20 ENCOUNTER — Ambulatory Visit (INDEPENDENT_AMBULATORY_CARE_PROVIDER_SITE_OTHER): Payer: BC Managed Care – PPO | Admitting: Podiatry

## 2018-11-20 ENCOUNTER — Encounter: Payer: Self-pay | Admitting: Podiatry

## 2018-11-20 ENCOUNTER — Other Ambulatory Visit: Payer: Self-pay

## 2018-11-20 VITALS — Temp 97.2°F

## 2018-11-20 DIAGNOSIS — Z9889 Other specified postprocedural states: Secondary | ICD-10-CM

## 2018-11-20 DIAGNOSIS — M67471 Ganglion, right ankle and foot: Secondary | ICD-10-CM

## 2018-12-03 ENCOUNTER — Ambulatory Visit (INDEPENDENT_AMBULATORY_CARE_PROVIDER_SITE_OTHER)
Admission: RE | Admit: 2018-12-03 | Discharge: 2018-12-03 | Disposition: A | Discharge status: Home / Self Care | Payer: BC Managed Care – PPO | Source: Ambulatory Visit | Attending: Acute Care | Admitting: Acute Care

## 2018-12-03 ENCOUNTER — Other Ambulatory Visit: Payer: Self-pay

## 2018-12-03 DIAGNOSIS — Z122 Encounter for screening for malignant neoplasm of respiratory organs: Secondary | ICD-10-CM

## 2018-12-03 DIAGNOSIS — F17210 Nicotine dependence, cigarettes, uncomplicated: Secondary | ICD-10-CM | POA: Diagnosis not present

## 2018-12-04 ENCOUNTER — Other Ambulatory Visit: Payer: Self-pay | Admitting: *Deleted

## 2018-12-04 DIAGNOSIS — Z122 Encounter for screening for malignant neoplasm of respiratory organs: Secondary | ICD-10-CM

## 2018-12-04 DIAGNOSIS — F17210 Nicotine dependence, cigarettes, uncomplicated: Secondary | ICD-10-CM

## 2018-12-22 NOTE — Progress Notes (Signed)
  Subjective:  Patient ID: Clifford Newman, male    DOB: [birth year],  MRN: [id number]  Chief Complaint  Patient presents with  . Cyst    Pt here for follow up ganglion cyst excision right foot. Pt states healing well, no concerns.    DOS: 10/22/2018 Procedure: Removal of ganglion Right foot  58 y.o. male returns for post-op check. Hx as above. Denies post-op isues.  Doing well walking in normal shoe gear without issue.  Review of Systems: Negative except as noted in the HPI. Denies N/V/F/Ch.  Past Medical History:  Diagnosis Date  . Colon polyp 2001   Dr Fuller Plan  . Hyperlipidemia     Current Outpatient Medications:  .  aspirin EC 81 MG tablet, Take 1 tablet (81 mg total) by mouth daily., Disp: 365 tablet, Rfl: 3 .  atorvastatin (LIPITOR) 20 MG tablet, Take 1 tablet (20 mg total) by mouth daily., Disp: 90 tablet, Rfl: 0 .  cephALEXin (KEFLEX) 500 MG capsule, Take 1 capsule (500 mg total) by mouth 2 (two) times daily., Disp: 14 capsule, Rfl: 0 .  DUEXIS 800-26.6 MG TABS, TAKE ONE EVERY 8 HOURS AS NEEDED FOR FEET PAIN WITH FOOD., Disp: 90 tablet, Rfl: 0 .  fluticasone (FLONASE) 50 MCG/ACT nasal spray, Place 2 sprays into both nostrils daily., Disp: 16 g, Rfl: 6 .  HYDROcodone-acetaminophen (NORCO) 5-325 MG tablet, Take 1 tablet by mouth every 6 (six) hours as needed for moderate pain., Disp: 20 tablet, Rfl: 0 .  sildenafil (REVATIO) 20 MG tablet, Take two to five daily 45 minutes prior as needed., Disp: 50 tablet, Rfl: 1  Social History   Tobacco Use  Smoking Status Former Smoker  . Packs/day: 1.50  . Years: 37.00  . Pack years: 55.50  . Types: Cigarettes  . Quit date: 12/20/2014  . Years since quitting: 4.0  Smokeless Tobacco Current User  Tobacco Comment   see Problem List, e-cig without nicotine    No Known Allergies Objective:   Vitals:   11/20/18 1429  Temp: (!) 97.2 F (36.2 C)   There is no height or weight on file to calculate BMI. Constitutional Well developed.  Well nourished.  Vascular Foot warm and well perfused. Capillary refill normal to all digits.   Neurologic Normal speech. Oriented to person, place, and time. Epicritic sensation to light touch grossly present bilaterally.  Dermatologic  skin well-healed   Orthopedic:  No tenderness to palpation noted about the surgical site.   Radiographs: None Assessment:   1. Ganglion cyst of right foot   2. Post-operative state    Plan:  Patient was evaluated and treated and all questions answered.  S/p foot surgery right -Doing very well no pain pleased with results weight-bear as tolerated normal shoe gear follow-up should pain persist.  Advised to follow-up with left foot cyst is hurting him we could discuss excision  No follow-ups on file.

## 2019-01-02 ENCOUNTER — Telehealth: Payer: Self-pay | Admitting: Family Medicine

## 2019-01-02 NOTE — Telephone Encounter (Signed)
Forms received.  

## 2019-01-02 NOTE — Telephone Encounter (Signed)
Form completed, placed on MD desk for signature.

## 2019-01-02 NOTE — Telephone Encounter (Signed)
Patient came by and dropped office medical eval for soical sercice form. Paperwork will be in providers folder at the front. Please call patient once forms are completed.

## 2019-01-05 NOTE — Telephone Encounter (Signed)
Pt is aware that forms are up front and ready to be picked up.

## 2019-01-14 ENCOUNTER — Other Ambulatory Visit: Payer: Self-pay

## 2019-01-15 ENCOUNTER — Ambulatory Visit: Payer: BC Managed Care – PPO | Admitting: Family Medicine

## 2019-01-15 ENCOUNTER — Ambulatory Visit (INDEPENDENT_AMBULATORY_CARE_PROVIDER_SITE_OTHER): Payer: BC Managed Care – PPO

## 2019-01-15 ENCOUNTER — Encounter: Payer: Self-pay | Admitting: Family Medicine

## 2019-01-15 DIAGNOSIS — Z23 Encounter for immunization: Secondary | ICD-10-CM | POA: Diagnosis not present

## 2019-01-15 NOTE — Progress Notes (Signed)
Patient presents in clinic today for Influenza vaccination. IM injection was given in the left deltoid. Patient tolerated the injection well. No signs or symptoms of a reaction were noted prior to patient leaving the nurse visit. 

## 2019-01-29 ENCOUNTER — Encounter: Payer: Self-pay | Admitting: Family Medicine

## 2019-01-29 DIAGNOSIS — I2510 Atherosclerotic heart disease of native coronary artery without angina pectoris: Secondary | ICD-10-CM

## 2019-01-29 DIAGNOSIS — E7800 Pure hypercholesterolemia, unspecified: Secondary | ICD-10-CM

## 2019-01-29 MED ORDER — ASPIRIN EC 81 MG PO TBEC: 81.0000 mg | DELAYED_RELEASE_TABLET | Freq: Every day | ORAL | 3 refills | Status: DC

## 2019-03-22 ENCOUNTER — Other Ambulatory Visit: Payer: Self-pay | Admitting: Family Medicine

## 2019-03-22 DIAGNOSIS — E7800 Pure hypercholesterolemia, unspecified: Secondary | ICD-10-CM

## 2019-03-22 DIAGNOSIS — I2510 Atherosclerotic heart disease of native coronary artery without angina pectoris: Secondary | ICD-10-CM

## 2019-07-09 ENCOUNTER — Ambulatory Visit: Payer: BC Managed Care – PPO

## 2019-07-13 ENCOUNTER — Ambulatory Visit: Payer: BC Managed Care – PPO | Attending: Family

## 2019-07-13 DIAGNOSIS — Z23 Encounter for immunization: Secondary | ICD-10-CM | POA: Insufficient documentation

## 2019-07-13 NOTE — Progress Notes (Signed)
   Covid-19 Vaccination Clinic  Name:  Clifford Newman    MRN: [id number] DOB: 12/24/1960  07/13/2019  [name] was observed post Covid-19 immunization for 15 minutes without incidence. He was provided with Vaccine Information Sheet and instruction to access the V-Safe system.   [name] was instructed to call 911 with any severe reactions post vaccine: Marland Kitchen Difficulty breathing  . Swelling of your face and throat  . A fast heartbeat  . A bad rash all over your body  . Dizziness and weakness    Immunizations Administered    Name Date Dose VIS Date Route   Moderna COVID-19 Vaccine 07/13/2019  2:06 PM 0.5 mL 04/21/2019 Intramuscular   Manufacturer: Moderna   Lot: [id number]   [id number]

## 2019-07-23 ENCOUNTER — Encounter: Payer: Self-pay | Admitting: Family Medicine

## 2019-07-23 ENCOUNTER — Other Ambulatory Visit: Payer: Self-pay

## 2019-07-23 ENCOUNTER — Ambulatory Visit (INDEPENDENT_AMBULATORY_CARE_PROVIDER_SITE_OTHER): Payer: BC Managed Care – PPO | Admitting: Family Medicine

## 2019-07-23 VITALS — BP 138/72 | HR 88 | Temp 95.4°F | Ht 73.0 in | Wt 289.2 lb

## 2019-07-23 DIAGNOSIS — R972 Elevated prostate specific antigen [PSA]: Secondary | ICD-10-CM

## 2019-07-23 DIAGNOSIS — E782 Mixed hyperlipidemia: Secondary | ICD-10-CM

## 2019-07-23 DIAGNOSIS — I2510 Atherosclerotic heart disease of native coronary artery without angina pectoris: Secondary | ICD-10-CM

## 2019-07-23 DIAGNOSIS — E7800 Pure hypercholesterolemia, unspecified: Secondary | ICD-10-CM | POA: Diagnosis not present

## 2019-07-23 DIAGNOSIS — R739 Hyperglycemia, unspecified: Secondary | ICD-10-CM | POA: Diagnosis not present

## 2019-07-23 DIAGNOSIS — Z0000 Encounter for general adult medical examination without abnormal findings: Secondary | ICD-10-CM

## 2019-07-23 DIAGNOSIS — B360 Pityriasis versicolor: Secondary | ICD-10-CM | POA: Insufficient documentation

## 2019-07-23 MED ORDER — FLUCONAZOLE 150 MG PO TABS: ORAL_TABLET | ORAL | 0 refills | Status: DC

## 2019-07-23 NOTE — Progress Notes (Addendum)
Established Patient Office Visit  Subjective:  Patient ID: Clifford Newman, male    DOB: 1960/10/05  Age: 59 y.o. MRN: [id number]  CC:  Chief Complaint  Patient presents with  . Annual Exam    CPE, no concerns.     HPI Clifford Newman presents for a complete physical and follow-up on his elevated LDL cholesterol.  Continues to abstain from smoking.  Recent low-dose CT of the chest did show vascular disease in his coronary arteries.  Denies chest pain.  He assures me that he has been compliant daily with his atorvastatin.  He is having no issues taking it.  Has a rash on his posterior left shoulder that he would like for me to take a look at it.  Exercising most days of the week when walking up to 8000 steps.  He has improved his diet to include more fruits vegetables and fiber.  Tells of nocturia x1.  Urine flow is otherwise good.  Recent eye check this week.  Scheduled for colonoscopy in June.  He did receive dental care over this past year.  Past Medical History:  Diagnosis Date  . Colon polyp 2001   Dr Fuller Plan  . Hyperlipidemia     Past Surgical History:  Procedure Laterality Date  . COLONOSCOPY  2006 & 2011   negative, Dr Fuller Plan  . COLONOSCOPY W/ POLYPECTOMY  2001  . CYSTECTOMY     Ganglion cyst-left wrist   . HAND SURGERY  1983   Right thumb surgery   . ROTATOR CUFF REPAIR Right   . WISDOM TOOTH EXTRACTION      Family History  Problem Relation Age of Onset  . Colon cancer Father 65  . Diabetes Father        IDDM  . Stroke Paternal Grandfather        >55  . Stroke Paternal Grandmother        > 65  . Heart disease Neg Hx     Social History   Socioeconomic History  . Marital status: Married    Spouse name: Not on file  . Number of children: Not on file  . Years of education: Not on file  . Highest education level: Not on file  Occupational History  . Not on file  Tobacco Use  . Smoking status: Former Smoker    Packs/day: 1.50    Years: 37.00    Pack years:  55.50    Types: Cigarettes    Quit date: 12/20/2014    Years since quitting: 4.6  . Smokeless tobacco: Current User  . Tobacco comment: see Problem List, e-cig without nicotine  Substance and Sexual Activity  . Alcohol use: Yes    Alcohol/week: 2.0 standard drinks    Types: 2 Shots of liquor per week  . Drug use: No  . Sexual activity: Not on file  Other Topics Concern  . Not on file  Social History Narrative  . Not on file   Social Determinants of Health   Financial Resource Strain:   . Difficulty of Paying Living Expenses: Not on file  Food Insecurity:   . Worried About Charity fundraiser in the Last Year: Not on file  . Ran Out of Food in the Last Year: Not on file  Transportation Needs:   . Lack of Transportation (Medical): Not on file  . Lack of Transportation (Non-Medical): Not on file  Physical Activity:   . Days of Exercise per Week: Not on file  .  Minutes of Exercise per Session: Not on file  Stress:   . Feeling of Stress : Not on file  Social Connections:   . Frequency of Communication with Friends and Family: Not on file  . Frequency of Social Gatherings with Friends and Family: Not on file  . Attends Religious Services: Not on file  . Active Member of Clubs or Organizations: Not on file  . Attends Archivist Meetings: Not on file  . Marital Status: Not on file  Intimate Partner Violence:   . Fear of Current or Ex-Partner: Not on file  . Emotionally Abused: Not on file  . Physically Abused: Not on file  . Sexually Abused: Not on file    Outpatient Medications Prior to Visit  Medication Sig Dispense Refill  . aspirin EC 81 MG tablet Take 1 tablet (81 mg total) by mouth daily. 365 tablet 3  . atorvastatin (LIPITOR) 20 MG tablet TAKE 1 TABLET BY MOUTH EVERY DAY 90 tablet 1  . fluticasone (FLONASE) 50 MCG/ACT nasal spray Place 2 sprays into both nostrils daily. (Patient not taking: Reported on 07/23/2019) 16 g 6  . HYDROcodone-acetaminophen (NORCO)  5-325 MG tablet Take 1 tablet by mouth every 6 (six) hours as needed for moderate pain. (Patient not taking: Reported on 07/23/2019) 20 tablet 0  . sildenafil (REVATIO) 20 MG tablet Take two to five daily 45 minutes prior as needed. (Patient not taking: Reported on 07/23/2019) 50 tablet 1  . cephALEXin (KEFLEX) 500 MG capsule Take 1 capsule (500 mg total) by mouth 2 (two) times daily. (Patient not taking: Reported on 07/23/2019) 14 capsule 0  . DUEXIS 800-26.6 MG TABS TAKE ONE EVERY 8 HOURS AS NEEDED FOR FEET PAIN WITH FOOD. (Patient not taking: Reported on 07/23/2019) 90 tablet 0   No facility-administered medications prior to visit.    No Known Allergies  ROS Review of Systems  Constitutional: Negative for chills, diaphoresis, fatigue, fever and unexpected weight change.  HENT: Negative.   Eyes: Negative for photophobia and visual disturbance.  Respiratory: Negative.   Cardiovascular: Negative.   Gastrointestinal: Negative.   Endocrine: Negative for polyphagia and polyuria.  Genitourinary: Negative.   Musculoskeletal: Negative for gait problem and joint swelling.  Skin: Negative for pallor and rash.  Allergic/Immunologic: Negative for immunocompromised state.  Neurological: Negative for light-headedness and numbness.  Hematological: Does not bruise/bleed easily.  Psychiatric/Behavioral: Negative.       Objective:    Physical Exam  Constitutional: He is oriented to person, place, and time. He appears well-developed and well-nourished. No distress.  HENT:  Head: Normocephalic and atraumatic.  Right Ear: External ear normal.  Left Ear: External ear normal.  Eyes: Conjunctivae are normal. Right eye exhibits no discharge. Left eye exhibits no discharge. No scleral icterus.  Neck: No JVD present. No tracheal deviation present.  Cardiovascular: Normal rate, regular rhythm and normal heart sounds.  Pulmonary/Chest: Effort normal and breath sounds normal. No stridor.  Abdominal: Soft. Bowel  sounds are normal. He exhibits no distension. There is no abdominal tenderness. There is no rebound.  Genitourinary: Rectum:     Guaiac result negative.     No rectal mass, anal fissure, tenderness, external hemorrhoid, internal hemorrhoid or abnormal anal tone.  Prostate is enlarged. Prostate is not tender.  Musculoskeletal:        General: No edema.  Neurological: He is alert and oriented to person, place, and time.  Skin: Skin is warm and dry. He is not diaphoretic.  Psychiatric: He has a normal mood and affect. His behavior is normal.    BP 138/72   Pulse 88   Temp (!) 95.4 F (35.2 C) (Tympanic)   Ht 6\' 1"  (1.854 m)   Wt 289 lb 3.2 oz (131.2 kg)   SpO2 98%   BMI 38.16 kg/m  Wt Readings from Last 3 Encounters:  07/23/19 289 lb 3.2 oz (131.2 kg)  10/09/18 (!) 300 lb 8 oz (136.3 kg)  05/20/18 288 lb 12.8 oz (131 kg)     Health Maintenance Due  Topic Date Due  . TETANUS/TDAP  01/22/2019    There are no preventive care reminders to display for this patient.  Lab Results  Component Value Date   TSH 1.85 08/12/2017   Lab Results  Component Value Date   WBC 3.9 (L) 07/24/2019   HGB 14.4 07/24/2019   HCT 43.2 07/24/2019   MCV 89.2 07/24/2019   PLT 206.0 07/24/2019   Lab Results  Component Value Date   NA 140 07/24/2019   K 4.1 07/24/2019   CO2 29 07/24/2019   GLUCOSE 104 (H) 07/24/2019   BUN 14 07/24/2019   CREATININE 1.01 07/24/2019   BILITOT 0.5 07/24/2019   ALKPHOS 42 07/24/2019   AST 15 07/24/2019   ALT 21 07/24/2019   PROT 7.2 07/24/2019   ALBUMIN 4.1 07/24/2019   CALCIUM 9.7 07/24/2019   GFR 90.92 07/24/2019   Lab Results  Component Value Date   CHOL 185 07/24/2019   Lab Results  Component Value Date   HDL 71.20 07/24/2019   Lab Results  Component Value Date   LDLCALC 101 (H) 07/24/2019   Lab Results  Component Value Date   TRIG 66.0 07/24/2019   Lab Results  Component Value Date   CHOLHDL 3 07/24/2019   Lab Results    Component Value Date   HGBA1C 6.5 07/24/2019      Assessment & Plan:   Problem List Items Addressed This Visit      Cardiovascular and Mediastinum   ASCVD (arteriosclerotic cardiovascular disease)   Relevant Medications   atorvastatin (LIPITOR) 40 MG tablet   Other Relevant Orders   Comprehensive metabolic panel (Completed)   Lipid panel (Completed)     Musculoskeletal and Integument   Tinea versicolor   Relevant Medications   fluconazole (DIFLUCAN) 150 MG tablet     Other   Mixed hyperlipidemia   Relevant Medications   atorvastatin (LIPITOR) 40 MG tablet   Other Relevant Orders   Comprehensive metabolic panel (Completed)   Lipid panel (Completed)   Healthcare maintenance   Relevant Orders   CBC (Completed)   PSA (Completed)   Urinalysis, Routine w reflex microscopic (Completed)   Elevated blood sugar   Relevant Orders   Hemoglobin A1c (Completed)   Elevated LDL cholesterol level - Primary   Relevant Orders   Comprehensive metabolic panel (Completed)   Lipid panel (Completed)   LDL cholesterol, direct (Completed)   Elevated PSA, less than 10 ng/ml      Meds ordered this encounter  Medications  . fluconazole (DIFLUCAN) 150 MG tablet    Sig: Take 2 tablets today and repeat in one week.    Dispense:  4 tablet    Refill:  0  . atorvastatin (LIPITOR) 40 MG tablet    Sig: Take 1 tablet (40 mg total) by mouth daily.    Dispense:  90 tablet    Refill:  3    Follow-up: Return in about 6 months (around  01/23/2020).   Patient was given information on health maintenance and disease prevention as well as arterial sclerosis and tinea versicolor. Libby Maw, MD

## 2019-07-23 NOTE — Patient Instructions (Signed)
Coronary Artery Disease, Male Coronary artery disease (CAD) is a condition in which the arteries that lead to the heart (coronary arteries) become narrow or blocked. The narrowing or blockage can lead to decreased blood flow to the heart. Prolonged reduced blood flow can cause a heart attack (myocardial infarction or MI). This condition may also be called coronary heart disease. Because CAD is the leading cause of death in men, it is important to understand what causes this condition and how it is treated. What are the causes? CAD is most often caused by atherosclerosis. This is the buildup of fat and cholesterol (plaque) on the inside of the arteries. Over time, the plaque may narrow or block the artery, reducing blood flow to the heart. Plaque can also become weak and break off within a coronary artery and cause a sudden blockage. Other less common causes of CAD include:  A blood clot or a piece of a blood clot or other substance that blocks the flow of blood in a coronary artery (embolism).  A tearing of the artery (spontaneous coronary artery dissection).  An enlargement of an artery (aneurysm).  Inflammation (vasculitis) in the artery wall. What increases the risk? The following factors may make you more likely to develop this condition:  Age. Men over age 29 are at a greater risk of CAD.  Family history of CAD.  Gender. Men often develop CAD earlier in life than women.  High blood pressure (hypertension).  Diabetes.  High cholesterol levels.  Tobacco use.  Excessive alcohol use.  Lack of exercise.  A diet high in saturated and trans fats, such as fried food and processed meat. Other possible risk factors include:  High stress levels.  Depression.  Obesity.  Sleep apnea. What are the signs or symptoms? Many people do not have any symptoms during the early stages of CAD. As the condition progresses, symptoms may include:  Chest pain (angina). The pain can: ? Feel  like crushing or squeezing, or like a tightness, pressure, fullness, or heaviness in the chest. ? Last more than a few minutes or can stop and recur. The pain tends to get worse with exercise or stress and to fade with rest.  Pain in the arms, neck, jaw, ear, or back.  Unexplained heartburn or indigestion.  Shortness of breath.  Nausea or vomiting.  Sudden light-headedness.  Sudden cold sweats.  Fluttering or fast heartbeat (palpitations). How is this diagnosed? This condition is diagnosed based on:  Your family and medical history.  A physical exam.  Tests, including: ? A test to check the electrical signals in your heart (electrocardiogram). ? Exercise stress test. This looks for signs of blockage when the heart is stressed with exercise, such as running on a treadmill. ? Pharmacologic stress test. This test looks for signs of blockage when the heart is being stressed with a medicine. ? Blood tests. ? Coronary angiogram. This is a procedure to look at the coronary arteries to see if there is any blockage. During this test, a dye is injected into your arteries so they appear on an X-ray. ? Coronary artery CT scan. This CT scan helps detect calcium deposits in your coronary arteries. Calcium deposits are an indicator of CAD. ? A test that uses sound waves to take a picture of your heart (echocardiogram). ? Chest X-ray. How is this treated? This condition may be treated by:  Healthy lifestyle changes to reduce risk factors.  Medicines such as: ? Antiplatelet medicines and blood-thinning medicines, such  as aspirin. These help to prevent blood clots. ? Nitroglycerin. ? Blood pressure medicines. ? Cholesterol-lowering medicine.  Coronary angioplasty and stenting. During this procedure, a thin, flexible tube is inserted through a blood vessel and into a blocked artery. A balloon or similar device on the end of the tube is inflated to open up the artery. In some cases, a small,  mesh tube (stent) is inserted into the artery to keep it open.  Coronary artery bypass surgery. During this surgery, veins or arteries from other parts of the body are used to create a bypass around the blockage and allow blood to reach your heart. Follow these instructions at home: Medicines  Take over-the-counter and prescription medicines only as told by your health care provider.  Do not take the following medicines unless your health care provider approves: ? NSAIDs, such as ibuprofen, naproxen, or celecoxib. ? Vitamin supplements that contain vitamin A, vitamin E, or both. Lifestyle  Follow an exercise program approved by your health care provider. Aim for 150 minutes of moderate exercise or 75 minutes of vigorous exercise each week.  Maintain a healthy weight or lose weight as approved by your health care provider.  Learn to manage stress or try to limit your stress. Ask your health care provider for suggestions if you need help.  Get screened for depression and seek treatment, if needed.  Do not use any products that contain nicotine or tobacco, such as cigarettes, e-cigarettes, and chewing tobacco. If you need help quitting, ask your health care provider.  Do not use illegal drugs. Eating and drinking   Follow a heart-healthy diet. A dietitian can help educate you about healthy food options and changes. In general, eat plenty of fruits and vegetables, lean meats, and whole grains.  Avoid foods high in: ? Sugar. ? Salt (sodium). ? Saturated fat, such as processed or fatty meat. ? Trans fat, such as fried foods.  Use healthy cooking methods such as roasting, grilling, broiling, baking, poaching, steaming, or stir-frying.  Do not drink alcohol if your health care provider tells you not to drink.  If you drink alcohol: ? Limit how much you have to 0-2 drinks per day. ? Be aware of how much alcohol is in your drink. In the [place] one drink equals one 12 oz bottle of beer  (355 mL), one 5 oz glass of wine (148 mL), or one 1 oz glass of hard liquor (44 mL). General instructions  Manage any other health conditions, such as hypertension and diabetes. These conditions affect your heart.  Your health care provider may ask you to monitor your blood pressure. Ideally, your blood pressure should be below 130/80.  Keep all follow-up visits as told by your health care provider. This is important. Get help right away if:  You have pain in your chest, neck, ear, arm, jaw, stomach, or back that: ? Lasts more than a few minutes. ? Is recurring. ? Is not relieved by taking medicine under your tongue (sublingual nitroglycerin).  You have profuse sweating without cause.  You have unexplained: ? Heartburn or indigestion. ? Shortness of breath or difficulty breathing. ? Fluttering or fast heartbeat (palpitations). ? Nausea or vomiting. ? Fatigue. ? Feelings of nervousness or anxiety. ? Weakness. ? Diarrhea.  You have sudden light-headedness or dizziness.  You faint.  You feel like hurting yourself or think about taking your own life. These symptoms may represent a serious problem that is an emergency. Do not wait to see if the  symptoms will go away. Get medical help right away. Call your local emergency services (911 in the [place] Do not drive yourself to the hospital. Summary  Coronary artery disease (CAD) is a condition in which the arteries that lead to the heart (coronary arteries) become narrow or blocked. The narrowing or blockage can lead to a heart attack.  Many people do not have any symptoms during the early stages of CAD.  CAD can be treated with lifestyle changes, medicines, surgery, or a combination of these treatments. This information is not intended to replace advice given to you by your health care provider. Make sure you discuss any questions you have with your health care provider. Document Revised: 01/24/2018 Document Reviewed:  01/14/2018 Elsevier Patient Education  2020 [place] Maintenance, Male Adopting a healthy lifestyle and getting preventive care are important in promoting health and wellness. Ask your health care provider about:  The right schedule for you to have regular tests and exams.  Things you can do on your own to prevent diseases and keep yourself healthy. What should I know about diet, weight, and exercise? Eat a healthy diet   Eat a diet that includes plenty of vegetables, fruits, low-fat dairy products, and lean protein.  Do not eat a lot of foods that are high in solid fats, added sugars, or sodium. Maintain a healthy weight Body mass index (BMI) is a measurement that can be used to identify possible weight problems. It estimates body fat based on height and weight. Your health care provider can help determine your BMI and help you achieve or maintain a healthy weight. Get regular exercise Get regular exercise. This is one of the most important things you can do for your health. Most adults should:  Exercise for at least 150 minutes each week. The exercise should increase your heart rate and make you sweat (moderate-intensity exercise).  Do strengthening exercises at least twice a week. This is in addition to the moderate-intensity exercise.  Spend less time sitting. Even light physical activity can be beneficial. Watch cholesterol and blood lipids Have your blood tested for lipids and cholesterol at 61 years of age, then have this test every 5 years. You may need to have your cholesterol levels checked more often if:  Your lipid or cholesterol levels are high.  You are older than 62 years of age.  You are at high risk for heart disease. What should I know about cancer screening? Many types of cancers can be detected early and may often be prevented. Depending on your health history and family history, you may need to have cancer screening at various ages. This may  include screening for:  Colorectal cancer.  Prostate cancer.  Skin cancer.  Lung cancer. What should I know about heart disease, diabetes, and high blood pressure? Blood pressure and heart disease  High blood pressure causes heart disease and increases the risk of stroke. This is more likely to develop in people who have high blood pressure readings, are of African descent, or are overweight.  Talk with your health care provider about your target blood pressure readings.  Have your blood pressure checked: ? Every 3-5 years if you are 80-61 years of age. ? Every year if you are 49 years old or older.  If you are between the ages of 78 and 67 and are a current or former smoker, ask your health care provider if you should have a one-time screening for abdominal aortic aneurysm (AAA). Diabetes  Have regular diabetes screenings. This checks your fasting blood sugar level. Have the screening done:  Once every three years after age 58 if you are at a normal weight and have a low risk for diabetes.  More often and at a younger age if you are overweight or have a high risk for diabetes. What should I know about preventing infection? Hepatitis B If you have a higher risk for hepatitis B, you should be screened for this virus. Talk with your health care provider to find out if you are at risk for hepatitis B infection. Hepatitis C Blood testing is recommended for:  Everyone born from [birth year] through 1967.  Anyone with known risk factors for hepatitis C. Sexually transmitted infections (STIs)  You should be screened each year for STIs, including gonorrhea and chlamydia, if: ? You are sexually active and are younger than 61 years of age. ? You are older than 61 years of age and your health care provider tells you that you are at risk for this type of infection. ? Your sexual activity has changed since you were last screened, and you are at increased risk for chlamydia or gonorrhea. Ask your  health care provider if you are at risk.  Ask your health care provider about whether you are at high risk for HIV. Your health care provider may recommend a prescription medicine to help prevent HIV infection. If you choose to take medicine to prevent HIV, you should first get tested for HIV. You should then be tested every 3 months for as long as you are taking the medicine. Follow these instructions at home: Lifestyle  Do not use any products that contain nicotine or tobacco, such as cigarettes, e-cigarettes, and chewing tobacco. If you need help quitting, ask your health care provider.  Do not use street drugs.  Do not share needles.  Ask your health care provider for help if you need support or information about quitting drugs. Alcohol use  Do not drink alcohol if your health care provider tells you not to drink.  If you drink alcohol: ? Limit how much you have to 0-2 drinks a day. ? Be aware of how much alcohol is in your drink. In the [place] one drink equals one 12 oz bottle of beer (355 mL), one 5 oz glass of wine (148 mL), or one 1 oz glass of hard liquor (44 mL). General instructions  Schedule regular health, dental, and eye exams.  Stay current with your vaccines.  Tell your health care provider if: ? You often feel depressed. ? You have ever been abused or do not feel safe at home. Summary  Adopting a healthy lifestyle and getting preventive care are important in promoting health and wellness.  Follow your health care provider's instructions about healthy diet, exercising, and getting tested or screened for diseases.  Follow your health care provider's instructions on monitoring your cholesterol and blood pressure. This information is not intended to replace advice given to you by your health care provider. Make sure you discuss any questions you have with your health care provider. Document Revised: 04/30/2018 Document Reviewed: 04/30/2018 Elsevier Patient Education   2020 [employer]  Preventive Care 81-108 Years Old, Male Preventive care refers to lifestyle choices and visits with your health care provider that can promote health and wellness. This includes:  A yearly physical exam. This is also called an annual well check.  Regular dental and eye exams.  Immunizations.  Screening for certain conditions.  Healthy  lifestyle choices, such as eating a healthy diet, getting regular exercise, not using drugs or products that contain nicotine and tobacco, and limiting alcohol use. What can I expect for my preventive care visit? Physical exam Your health care provider will check:  Height and weight. These may be used to calculate body mass index (BMI), which is a measurement that tells if you are at a healthy weight.  Heart rate and blood pressure.  Your skin for abnormal spots. Counseling Your health care provider may ask you questions about:  Alcohol, tobacco, and drug use.  Emotional well-being.  Home and relationship well-being.  Sexual activity.  Eating habits.  Work and work Statistician. What immunizations do I need?  Influenza (flu) vaccine  This is recommended every year. Tetanus, diphtheria, and pertussis (Tdap) vaccine  You may need a Td booster every 10 years. Varicella (chickenpox) vaccine  You may need this vaccine if you have not already been vaccinated. Zoster (shingles) vaccine  You may need this after age 47. Measles, mumps, and rubella (MMR) vaccine  You may need at least one dose of MMR if you were born in 1957 or later. You may also need a second dose. Pneumococcal conjugate (PCV13) vaccine  You may need this if you have certain conditions and were not previously vaccinated. Pneumococcal polysaccharide (PPSV23) vaccine  You may need one or two doses if you smoke cigarettes or if you have certain conditions. Meningococcal conjugate (MenACWY) vaccine  You may need this if you have certain  conditions. Hepatitis A vaccine  You may need this if you have certain conditions or if you travel or work in places where you may be exposed to hepatitis A. Hepatitis B vaccine  You may need this if you have certain conditions or if you travel or work in places where you may be exposed to hepatitis B. Haemophilus influenzae type b (Hib) vaccine  You may need this if you have certain risk factors. Human papillomavirus (HPV) vaccine  If recommended by your health care provider, you may need three doses over 6 months. You may receive vaccines as individual doses or as more than one vaccine together in one shot (combination vaccines). Talk with your health care provider about the risks and benefits of combination vaccines. What tests do I need? Blood tests  Lipid and cholesterol levels. These may be checked every 5 years, or more frequently if you are over 61 years old.  Hepatitis C test.  Hepatitis B test. Screening  Lung cancer screening. You may have this screening every year starting at age 12 if you have a 30-pack-year history of smoking and currently smoke or have quit within the past 15 years.  Prostate cancer screening. Recommendations will vary depending on your family history and other risks.  Colorectal cancer screening. All adults should have this screening starting at age 22 and continuing until age 14. Your health care provider may recommend screening at age 23 if you are at increased risk. You will have tests every 1-10 years, depending on your results and the type of screening test.  Diabetes screening. This is done by checking your blood sugar (glucose) after you have not eaten for a while (fasting). You may have this done every 1-3 years.  Sexually transmitted disease (STD) testing. Follow these instructions at home: Eating and drinking  Eat a diet that includes fresh fruits and vegetables, whole grains, lean protein, and low-fat dairy products.  Take vitamin and  mineral supplements as recommended by your  health care provider.  Do not drink alcohol if your health care provider tells you not to drink.  If you drink alcohol: ? Limit how much you have to 0-2 drinks a day. ? Be aware of how much alcohol is in your drink. In the [place] one drink equals one 12 oz bottle of beer (355 mL), one 5 oz glass of wine (148 mL), or one 1 oz glass of hard liquor (44 mL). Lifestyle  Take daily care of your teeth and gums.  Stay active. Exercise for at least 30 minutes on 5 or more days each week.  Do not use any products that contain nicotine or tobacco, such as cigarettes, e-cigarettes, and chewing tobacco. If you need help quitting, ask your health care provider.  If you are sexually active, practice safe sex. Use a condom or other form of protection to prevent STIs (sexually transmitted infections).  Talk with your health care provider about taking a low-dose aspirin every day starting at age 50. What's next?  Go to your health care provider once a year for a well check visit.  Ask your health care provider how often you should have your eyes and teeth checked.  Stay up to date on all vaccines. This information is not intended to replace advice given to you by your health care provider. Make sure you discuss any questions you have with your health care provider. Document Revised: 05/01/2018 Document Reviewed: 05/01/2018 Elsevier Patient Education  2020 [place] versicolor is a common fungal infection of the skin. It causes a rash that appears as light or dark patches on the skin. The rash most often occurs on the chest, back, neck, or upper arms. This condition is more common during warm weather. Other than affecting how your skin looks, tinea versicolor usually does not cause other problems. In most cases, the infection goes away in a few weeks with treatment. It may take a few months for the patches on your skin to return to  your usual skin color. What are the causes? This condition occurs when a type of fungus that is normally present on the skin starts to overgrow. This fungus is a kind of yeast. The exact cause of the overgrowth is not known. This condition cannot be passed from one person to another (it is not contagious). What increases the risk? This condition is more likely to develop when certain factors are present, such as:  Heat and humidity.  Sweating too much.  Hormone changes.  Oily skin.  A weak disease-fighting system (immunesystem). What are the signs or symptoms? Symptoms of this condition include:  A rash of light or dark patches on your skin. The rash may have: ? Patches of tan or pink spots (on light skin). ? Patches of white or brown spots (on dark skin). ? Patches of skin that do not tan. ? Well-marked edges. ? Scales on the discolored areas.  Mild itching. How is this diagnosed? A health care provider can usually diagnose this condition by looking at your skin. During the exam, he or she may use ultraviolet (UV) light to see how much of your skin has been affected. In some cases, a skin sample may be taken by scraping the rash. This sample will be viewed under a microscope to check for yeast overgrowth. How is this treated? Treatment for this condition may include:  Dandruff shampoo that is applied to the affected skin during showers or bathing.  Over-the-counter medicated  skin cream, lotion, or soaps.  Prescription antifungal medicine in the form of skin cream or pills.  Medicine to help reduce itching. Follow these instructions at home:  Take over-the-counter and prescription medicines only as told by your health care provider.  Apply dandruff shampoo to the affected area if your health care provider told you to do that. You may be instructed to scrub the affected skin for several minutes each day.  Do not scratch the affected area of skin.  Avoid hot and humid  conditions.  Do not use tanning booths.  Try to avoid sweating a lot. Contact a health care provider if:  Your symptoms get worse.  You have a fever.  You have redness, swelling, or pain at the site of your rash.  You have fluid or blood coming from your rash.  Your rash feels warm to the touch.  You have pus or a bad smell coming from your rash.  Your rash returns (recurs) after treatment. Summary  Tinea versicolor is a common fungal infection of the skin. It causes a rash that appears as light or dark patches on the skin.  The rash most often occurs on the chest, back, neck, or upper arms.  A health care provider can usually diagnose this condition by looking at your skin.  Treatment may include applying shampoo to the skin and taking or applying medicines. This information is not intended to replace advice given to you by your health care provider. Make sure you discuss any questions you have with your health care provider. Document Revised: 04/19/2017 Document Reviewed: 01/08/2017 Elsevier Patient Education  2020 [employer].

## 2019-07-24 ENCOUNTER — Other Ambulatory Visit (INDEPENDENT_AMBULATORY_CARE_PROVIDER_SITE_OTHER): Payer: BC Managed Care – PPO

## 2019-07-24 DIAGNOSIS — E782 Mixed hyperlipidemia: Secondary | ICD-10-CM | POA: Diagnosis not present

## 2019-07-24 DIAGNOSIS — R739 Hyperglycemia, unspecified: Secondary | ICD-10-CM | POA: Diagnosis not present

## 2019-07-24 DIAGNOSIS — I2510 Atherosclerotic heart disease of native coronary artery without angina pectoris: Secondary | ICD-10-CM

## 2019-07-24 DIAGNOSIS — E7800 Pure hypercholesterolemia, unspecified: Secondary | ICD-10-CM | POA: Diagnosis not present

## 2019-07-24 DIAGNOSIS — Z0000 Encounter for general adult medical examination without abnormal findings: Secondary | ICD-10-CM | POA: Diagnosis not present

## 2019-07-24 LAB — COMPREHENSIVE METABOLIC PANEL
ALT: 21 U/L (ref 0–53)
AST: 15 U/L (ref 0–37)
Albumin: 4.1 g/dL (ref 3.5–5.2)
Alkaline Phosphatase: 42 U/L (ref 39–117)
BUN: 14 mg/dL (ref 6–23)
CO2: 29 mEq/L (ref 19–32)
Calcium: 9.7 mg/dL (ref 8.4–10.5)
Chloride: 104 mEq/L (ref 96–112)
Creatinine, Ser: 1.01 mg/dL (ref 0.40–1.50)
GFR: 90.92 mL/min (ref >60.00)
Glucose, Bld: 104 mg/dL — ABNORMAL HIGH (ref 70–99)
Potassium: 4.1 mEq/L (ref 3.5–5.1)
Sodium: 140 mEq/L (ref 135–145)
Total Bilirubin: 0.5 mg/dL (ref 0.2–1.2)
Total Protein: 7.2 g/dL (ref 6.0–8.3)

## 2019-07-24 LAB — LIPID PANEL
Cholesterol: 185 mg/dL (ref 0–200)
HDL: 71.2 mg/dL (ref >39.00)
LDL Cholesterol: 101 mg/dL — ABNORMAL HIGH (ref 0–99)
NonHDL: 113.88
Total CHOL/HDL Ratio: 3
Triglycerides: 66 mg/dL (ref 0.0–149.0)
VLDL: 13.2 mg/dL (ref 0.0–40.0)

## 2019-07-24 LAB — URINALYSIS, ROUTINE W REFLEX MICROSCOPIC
Bilirubin Urine: NEGATIVE
Hgb urine dipstick: NEGATIVE
Ketones, ur: NEGATIVE
Leukocytes,Ua: NEGATIVE
Nitrite: NEGATIVE
Specific Gravity, Urine: 1.02 (ref 1.000–1.030)
Total Protein, Urine: NEGATIVE
Urine Glucose: NEGATIVE
Urobilinogen, UA: 0.2 (ref 0.0–1.0)
pH: 7 (ref 5.0–8.0)

## 2019-07-24 LAB — PSA: PSA: 4.85 ng/mL — ABNORMAL HIGH (ref 0.10–4.00)

## 2019-07-24 LAB — CBC
HCT: 43.2 % (ref 39.0–52.0)
Hemoglobin: 14.4 g/dL (ref 13.0–17.0)
MCHC: 33.3 g/dL (ref 30.0–36.0)
MCV: 89.2 fl (ref 78.0–100.0)
Platelets: 206 10*3/uL (ref 150.0–400.0)
RBC: 4.85 Mil/uL (ref 4.22–5.81)
RDW: 14.4 % (ref 11.5–15.5)
WBC: 3.9 10*3/uL — ABNORMAL LOW (ref 4.0–10.5)

## 2019-07-24 LAB — LDL CHOLESTEROL, DIRECT: Direct LDL: 99 mg/dL

## 2019-07-24 LAB — HEMOGLOBIN A1C: Hgb A1c MFr Bld: 6.5 % (ref 4.6–6.5)

## 2019-07-26 ENCOUNTER — Encounter: Payer: Self-pay | Admitting: Family Medicine

## 2019-07-27 DIAGNOSIS — R972 Elevated prostate specific antigen [PSA]: Secondary | ICD-10-CM | POA: Insufficient documentation

## 2019-07-27 MED ORDER — ATORVASTATIN CALCIUM 40 MG PO TABS: 40.0000 mg | ORAL_TABLET | Freq: Every day | ORAL | 3 refills | Status: DC

## 2019-07-27 NOTE — Addendum Note (Signed)
Addended by: Jon Billings on: 07/27/2019 09:19 AM   Modules accepted: Orders

## 2019-08-18 ENCOUNTER — Ambulatory Visit: Payer: BC Managed Care – PPO | Attending: Family

## 2019-08-18 DIAGNOSIS — Z23 Encounter for immunization: Secondary | ICD-10-CM

## 2019-08-18 NOTE — Progress Notes (Signed)
   [facility]  Name:  Clifford Newman    MRN: [id number] DOB: 06-15-1960  08/18/2019  [name] was observed post Covid-19 immunization for 15 minutes without incident. He was provided with Vaccine Information Sheet and instruction to access the V-Safe system.   [name] was instructed to call 911 with any severe reactions post vaccine: Marland Kitchen Difficulty breathing  . Swelling of face and throat  . A fast heartbeat  . A bad rash all over body  . Dizziness and weakness   Immunizations Administered    Name Date Dose VIS Date Route   Moderna COVID-19 Vaccine 08/18/2019  1:35 PM 0.5 mL 04/21/2019 Intramuscular   Manufacturer: Moderna   Lot: [id number]   False [id number]

## 2019-09-15 ENCOUNTER — Other Ambulatory Visit: Payer: Self-pay | Admitting: Family Medicine

## 2019-09-15 DIAGNOSIS — E7800 Pure hypercholesterolemia, unspecified: Secondary | ICD-10-CM

## 2019-09-15 DIAGNOSIS — I2510 Atherosclerotic heart disease of native coronary artery without angina pectoris: Secondary | ICD-10-CM

## 2019-09-27 IMAGING — CT CT CHEST LUNG CANCER SCREENING LOW DOSE
1 of 4 series · 10 of 40 positions shown, 13 images · non-contrast
Comparison: 09/02/2017 screening chest CT.

CLINICAL DATA: 58-year-old asymptomatic male current smoker with 56
pack-year smoking history.

EXAM:
CT CHEST WITHOUT CONTRAST LOW-DOSE FOR LUNG CANCER SCREENING
TECHNIQUE: Multidetector CT imaging of the chest was performed following the
standard protocol without IV contrast.

[ct lung segmentation data · axial · 0.79mm/px · z∈[-341,-341]mm · 10 of 310 frames shown]
[frame 1/310  mediastinal]
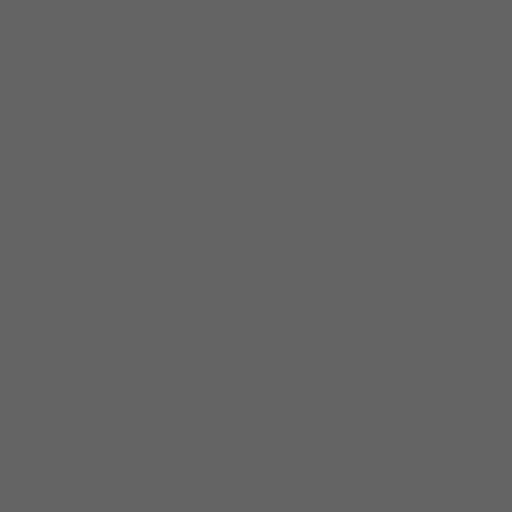
[frame 1/310  lung]
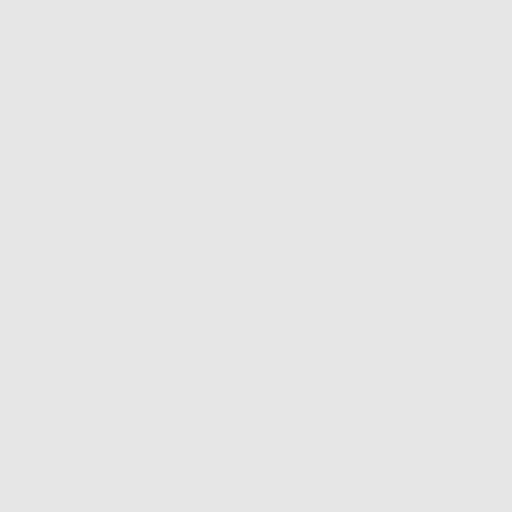
[frame 35/310  lung]
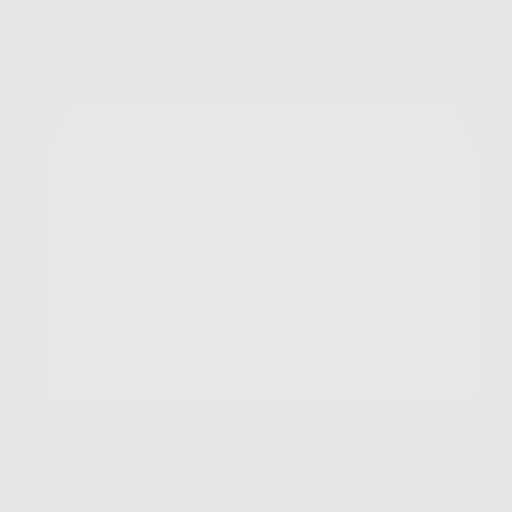
[frame 69/310  lung]
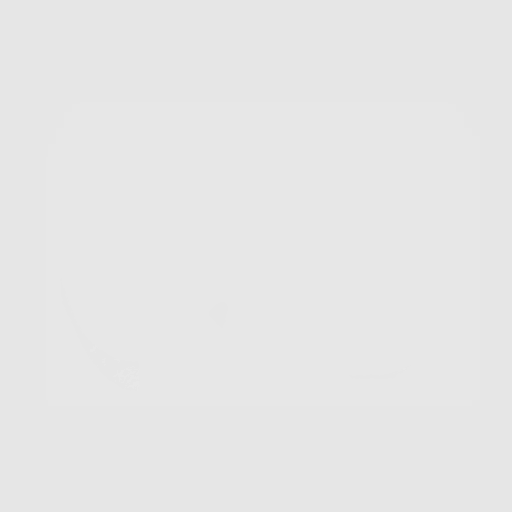
[frame 104/310  lung]
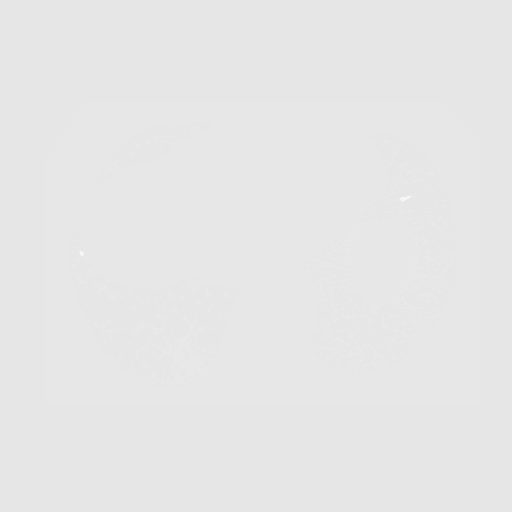
[frame 138/310  mediastinal]
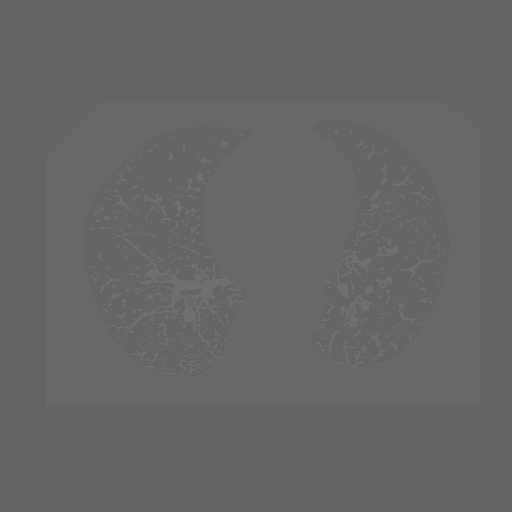
[frame 138/310  lung]
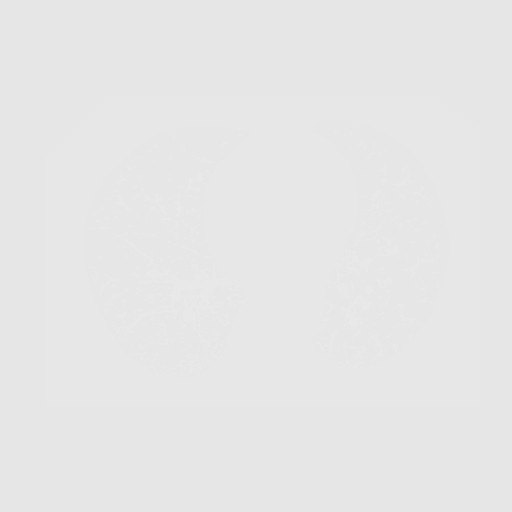
[frame 172/310  lung]
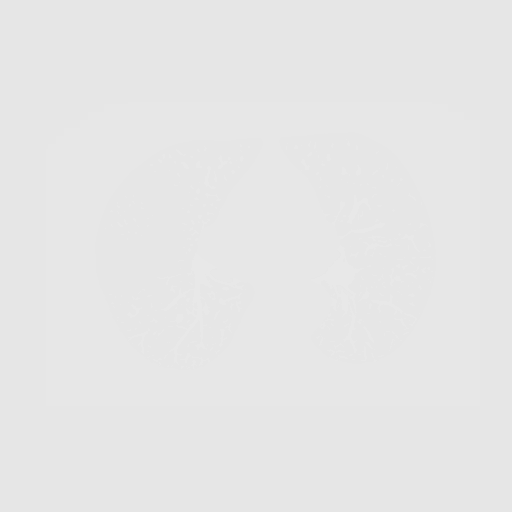
[frame 207/310  lung]
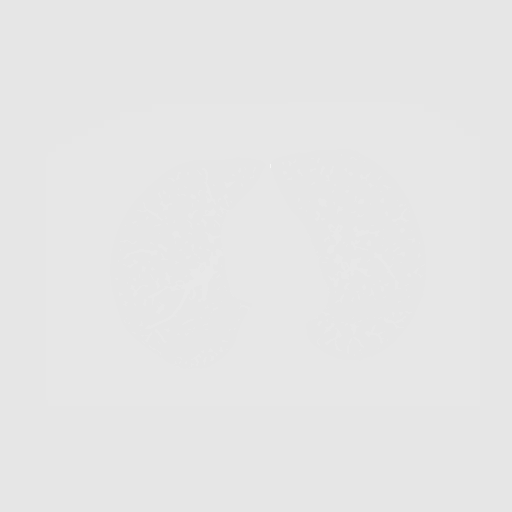
[frame 241/310  lung]
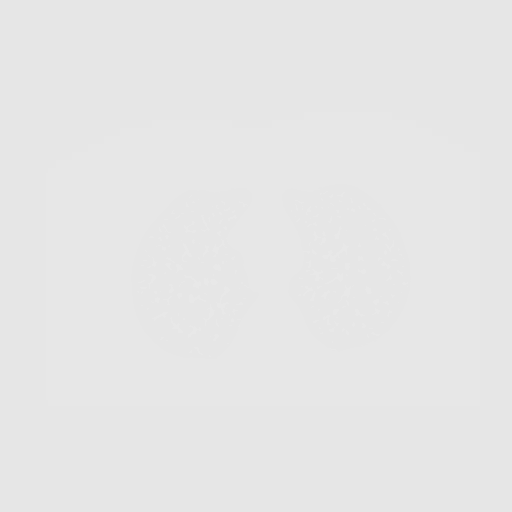
[frame 275/310  mediastinal]
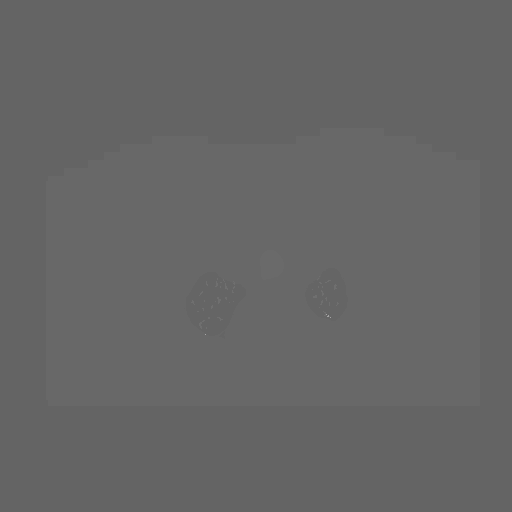
[frame 275/310  lung]
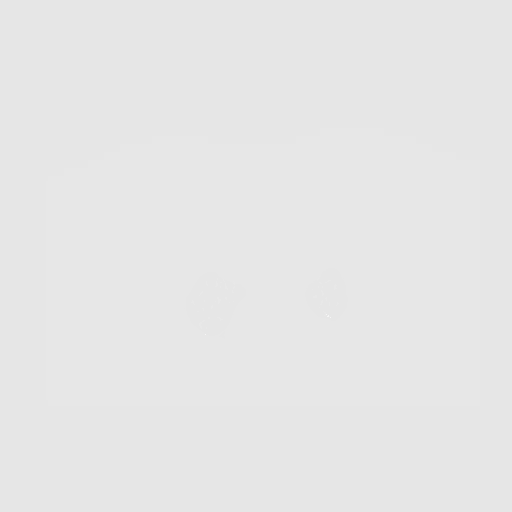
[frame 310/310  lung]
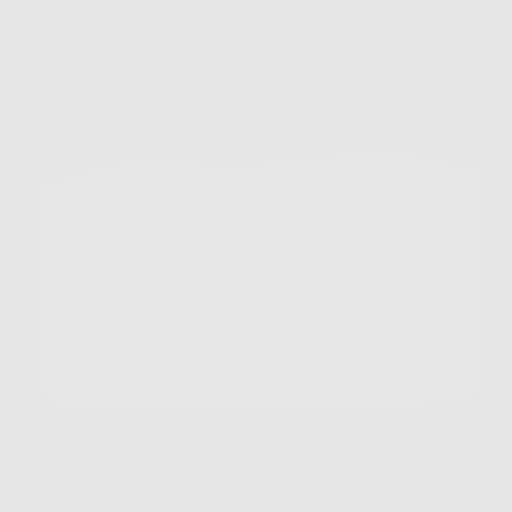

[10 of 40 positions shown; findings below may reference images not displayed]

FINDINGS: Cardiovascular: Normal heart size. No significant pericardial
effusion/thickening. Left anterior descending and right coronary
atherosclerosis. Atherosclerotic nonaneurysmal thoracic aorta.
Normal caliber pulmonary arteries.

Mediastinum/Nodes: No discrete thyroid nodules. Unremarkable
esophagus. No pathologically enlarged axillary, mediastinal or hilar
lymph nodes.

Lungs/Pleura: No pneumothorax. No pleural effusion. Minimal
centrilobular and paraseptal emphysema. No acute consolidative
airspace disease or lung masses. No significant growth of previously
visualized tiny right pulmonary nodule. No new significant pulmonary
nodules.

Upper abdomen: No acute abnormality. Small fat containing right
Bochdalek's hernia is unchanged.

Musculoskeletal:  No aggressive appearing focal osseous lesions.
IMPRESSION: 1. Lung-RADS 2, benign appearance or behavior. Continue annual
screening with low-dose chest CT without contrast in 12 months.
2. Two-vessel coronary atherosclerosis.

Aortic Atherosclerosis (EISEC-DD1.1) and Emphysema (EISEC-64U.Z).

## 2019-10-23 ENCOUNTER — Encounter: Payer: Self-pay | Admitting: Gastroenterology

## 2019-12-07 ENCOUNTER — Ambulatory Visit (AMBULATORY_SURGERY_CENTER): Payer: Self-pay

## 2019-12-07 ENCOUNTER — Other Ambulatory Visit: Payer: Self-pay

## 2019-12-07 VITALS — Ht 73.0 in | Wt 289.0 lb

## 2019-12-07 DIAGNOSIS — Z86010 Personal history of colonic polyps: Secondary | ICD-10-CM

## 2019-12-07 MED ORDER — POLYETHYLENE GLYCOL 3350 17 GM/SCOOP PO POWD: 1.0000 | Freq: Once | ORAL | 0 refills | Status: AC

## 2019-12-07 NOTE — Progress Notes (Signed)
No egg or soy allergy known to patient  No issues with past sedation with any surgeries or procedures No intubation problems in the past  No diet pills per patient No home 02 use per patient  No blood thinners per patient  Pt denies issues with constipation  No A fib or A flutter  EMMI video to [facility] 19 guidelines implemented in [facility] today   COVID vaccine completed on 07/2019 per pt;   Due to the COVID-19 pandemic we are asking patients to follow these guidelines. Please only bring one care partner. Please be aware that your care partner may wait in the car in the parking lot or if they feel like they will be too hot to wait in the car, they may wait in the lobby on the 4th floor. All care partners are required to wear a mask the entire time (we do not have any that we can provide them), they need to practice social distancing, and we will do a Covid check for all patient's and care partners when you arrive. Also we will check their temperature and your temperature. If the care partner waits in their car they need to stay in the parking lot the entire time and we will call them on their cell phone when the patient is ready for discharge so they can bring the car to the front of the building. Also all patient's will need to wear a mask into building.

## 2019-12-16 ENCOUNTER — Ambulatory Visit (INDEPENDENT_AMBULATORY_CARE_PROVIDER_SITE_OTHER)
Admission: RE | Admit: 2019-12-16 | Discharge: 2019-12-16 | Disposition: A | Discharge status: Home / Self Care | Payer: BC Managed Care – PPO | Source: Ambulatory Visit | Attending: Acute Care | Admitting: Acute Care

## 2019-12-16 ENCOUNTER — Other Ambulatory Visit: Payer: Self-pay

## 2019-12-16 DIAGNOSIS — F17200 Nicotine dependence, unspecified, uncomplicated: Secondary | ICD-10-CM | POA: Diagnosis not present

## 2019-12-16 DIAGNOSIS — Z122 Encounter for screening for malignant neoplasm of respiratory organs: Secondary | ICD-10-CM

## 2019-12-16 DIAGNOSIS — F17210 Nicotine dependence, cigarettes, uncomplicated: Secondary | ICD-10-CM

## 2019-12-17 NOTE — Progress Notes (Signed)

## 2019-12-21 ENCOUNTER — Other Ambulatory Visit: Payer: Self-pay

## 2019-12-21 ENCOUNTER — Ambulatory Visit (AMBULATORY_SURGERY_CENTER): Payer: BC Managed Care – PPO | Admitting: Gastroenterology

## 2019-12-21 ENCOUNTER — Encounter: Payer: Self-pay | Admitting: Gastroenterology

## 2019-12-21 VITALS — BP 123/78 | HR 71 | Temp 97.5°F | Resp 18 | Ht 73.0 in | Wt 289.0 lb

## 2019-12-21 DIAGNOSIS — Z800 Family history of malignant neoplasm of digestive organs: Secondary | ICD-10-CM

## 2019-12-21 DIAGNOSIS — Z86010 Personal history of colonic polyps: Secondary | ICD-10-CM

## 2019-12-21 MED ORDER — SODIUM CHLORIDE 0.9 % IV SOLN: 500.0000 mL | Freq: Once | INTRAVENOUS | Status: DC

## 2019-12-21 NOTE — Op Note (Addendum)
[facility] Patient Name: Clifford Newman Procedure Date: 12/21/2019 8:26 AM MRN: [id number] Endoscopist: Ladene Artist , MD Age: 59 Referring MD:  Date of Birth: 19-Apr-1961 Gender: Male Account #: [id number] Procedure:                Colonoscopy Indications:              Surveillance: Personal history of adenomatous                            polyps on last colonoscopy 5 years ago. Family                            history of colon cancer, first degree relative < 10. Medicines:                Monitored Anesthesia Care Procedure:                Pre-Anesthesia Assessment:                           - Prior to the procedure, a History and Physical                            was performed, and patient medications and                            allergies were reviewed. The patient's tolerance of                            previous anesthesia was also reviewed. The risks                            and benefits of the procedure and the sedation                            options and risks were discussed with the patient.                            All questions were answered, and informed consent                            was obtained. Prior Anticoagulants: The patient has                            taken no previous anticoagulant or antiplatelet                            agents. ASA Grade Assessment: II - A patient with                            mild systemic disease. After reviewing the risks                            and benefits, the patient was deemed in  satisfactory condition to undergo the procedure.                           After obtaining informed consent, the colonoscope                            was passed under direct vision. Throughout the                            procedure, the patient's blood pressure, pulse, and                            oxygen saturations were monitored continuously. The                            Colonoscope was  introduced through the anus and                            advanced to the the cecum, identified by                            appendiceal orifice and ileocecal valve. The                            ileocecal valve, appendiceal orifice, and rectum                            were photographed. The quality of the bowel                            preparation was good. The colonoscopy was performed                            without difficulty. The patient tolerated the                            procedure well. Scope In: 8:35:56 AM Scope Out: 8:48:20 AM Scope Withdrawal Time: 0 hours 10 minutes 21 seconds  Total Procedure Duration: 0 hours 12 minutes 24 seconds  Findings:                 The perianal and digital rectal examinations were                            normal.                           A few small-mouthed diverticula were found in the                            left colon. There was no evidence of diverticular                            bleeding.  The exam was otherwise without abnormality on                            direct and retroflexion views. Complications:            No immediate complications. Estimated blood loss:                            None. Estimated Blood Loss:     Estimated blood loss: none. Impression:               - Mild diverticulosis in the left colon.                           - The examination was otherwise normal on direct                            and retroflexion views.                           - No specimens collected. Recommendation:           - Repeat colonoscopy in 5 years for surveillance.                           - Patient has a contact number available for                            emergencies. The signs and symptoms of potential                            delayed complications were discussed with the                            patient. Return to normal activities tomorrow.                            Written  discharge instructions were provided to the                            patient.                           - Resume previous diet.                           - Continue present medications. Ladene Artist, MD 12/21/2019 8:53:36 AM This report has been signed electronically.

## 2019-12-21 NOTE — Progress Notes (Signed)
Pt's states no medical or surgical changes since previsit or office visit.  V/S-WR  Check-in-JB 

## 2019-12-21 NOTE — Progress Notes (Signed)
pt tolerated well. VSS. awake and to recovery. Report given to RN.  

## 2019-12-21 NOTE — Patient Instructions (Signed)
Impression/Recommendations:  Diverticulosis handout given to patient.  Repeat colonoscopy in 7 years for surveillance.  Resume previous diet. Continue present medications.  YOU HAD AN ENDOSCOPIC PROCEDURE TODAY AT [facility]:   Refer to the procedure report that was given to you for any specific questions about what was found during the examination.  If the procedure report does not answer your questions, please call your gastroenterologist to clarify.  If you requested that your care partner not be given the details of your procedure findings, then the procedure report has been included in a sealed envelope for you to review at your convenience later.  YOU SHOULD EXPECT: Some feelings of bloating in the abdomen. Passage of more gas than usual.  Walking can help get rid of the air that was put into your GI tract during the procedure and reduce the bloating. If you had a lower endoscopy (such as a colonoscopy or flexible sigmoidoscopy) you may notice spotting of blood in your stool or on the toilet paper. If you underwent a bowel prep for your procedure, you may not have a normal bowel movement for a few days.  Please Note:  You might notice some irritation and congestion in your nose or some drainage.  This is from the oxygen used during your procedure.  There is no need for concern and it should clear up in a day or so.  SYMPTOMS TO REPORT IMMEDIATELY:   Following lower endoscopy (colonoscopy or flexible sigmoidoscopy):  Excessive amounts of blood in the stool  Significant tenderness or worsening of abdominal pains  Swelling of the abdomen that is new, acute  Fever of 100F or higher For urgent or emergent issues, a gastroenterologist can be reached at any hour by calling [phone]. Do not use MyChart messaging for urgent concerns.    DIET:  We do recommend a small meal at first, but then you may proceed to your regular diet.  Drink plenty of fluids but you should  avoid alcoholic beverages for 24 hours.  ACTIVITY:  You should plan to take it easy for the rest of today and you should NOT DRIVE or use heavy machinery until tomorrow (because of the sedation medicines used during the test).    FOLLOW UP: Our staff will call the number listed on your records 48-72 hours following your procedure to check on you and address any questions or concerns that you may have regarding the information given to you following your procedure. If we do not reach you, we will leave a message.  We will attempt to reach you two times.  During this call, we will ask if you have developed any symptoms of COVID 19. If you develop any symptoms (ie: fever, flu-like symptoms, shortness of breath, cough etc.) before then, please call [phone].  If you test positive for Covid 19 in the 2 weeks post procedure, please call and report this information to [place].    If any biopsies were taken you will be contacted by phone or by letter within the next 1-3 weeks.  Please call us at [phone] if you have not heard about the biopsies in 3 weeks.    SIGNATURES/CONFIDENTIALITY: You and/or your care partner have signed paperwork which will be entered into your electronic medical record.  These signatures attest to the fact that that the information above on your After Visit Summary has been reviewed and is understood.  Full responsibility of the confidentiality of this discharge information lies with you  and/or your care-partner. 

## 2019-12-23 ENCOUNTER — Telehealth: Payer: Self-pay

## 2019-12-23 NOTE — Telephone Encounter (Signed)
°  Follow up Call-  Call back number 12/21/2019  Post procedure Call Back phone  # [phone]  Permission to leave phone message Yes  Some recent data might be hidden     Patient questions:  Do you have a fever, pain , or abdominal swelling? No. Pain Score  0 *  Have you tolerated food without any problems? Yes.    Have you been able to return to your normal activities? Yes.    Do you have any questions about your discharge instructions: Diet   No. Medications  No. Follow up visit  No.  Do you have questions or concerns about your Care? No.  Actions: * If pain score is 4 or above: No action needed, pain <4.  1. Have you developed a fever since your procedure? no  2.   Have you had an respiratory symptoms (SOB or cough) since your procedure? no 3.   Have you tested positive for COVID 19 since your procedure no 4.   Have you had any family members/close contacts diagnosed with the COVID 19 since your procedure?  no   If yes to any of these questions please route to Joylene John, RN and Erenest Rasher, RN

## 2019-12-29 ENCOUNTER — Other Ambulatory Visit: Payer: Self-pay | Admitting: *Deleted

## 2019-12-29 DIAGNOSIS — F17210 Nicotine dependence, cigarettes, uncomplicated: Secondary | ICD-10-CM

## 2020-03-20 ENCOUNTER — Encounter: Payer: Self-pay | Admitting: Family Medicine

## 2020-03-21 ENCOUNTER — Other Ambulatory Visit: Payer: Self-pay

## 2020-03-21 DIAGNOSIS — E7800 Pure hypercholesterolemia, unspecified: Secondary | ICD-10-CM

## 2020-03-21 DIAGNOSIS — I2510 Atherosclerotic heart disease of native coronary artery without angina pectoris: Secondary | ICD-10-CM

## 2020-03-21 MED ORDER — ASPIRIN EC 81 MG PO TBEC: 81.0000 mg | DELAYED_RELEASE_TABLET | Freq: Every day | ORAL | 3 refills | Status: DC

## 2020-07-17 ENCOUNTER — Other Ambulatory Visit: Payer: Self-pay | Admitting: Family Medicine

## 2020-07-17 DIAGNOSIS — I2510 Atherosclerotic heart disease of native coronary artery without angina pectoris: Secondary | ICD-10-CM

## 2020-07-17 DIAGNOSIS — E782 Mixed hyperlipidemia: Secondary | ICD-10-CM

## 2020-07-19 ENCOUNTER — Ambulatory Visit: Payer: BC Managed Care – PPO | Admitting: Family Medicine

## 2020-08-02 ENCOUNTER — Encounter: Payer: BC Managed Care – PPO | Admitting: Nurse Practitioner

## 2020-08-02 ENCOUNTER — Ambulatory Visit: Payer: BC Managed Care – PPO | Admitting: Family Medicine

## 2020-09-08 ENCOUNTER — Other Ambulatory Visit (HOSPITAL_COMMUNITY)
Admission: RE | Admit: 2020-09-08 | Discharge: 2020-09-08 | Disposition: A | Discharge status: Home / Self Care | Payer: BC Managed Care – PPO | Source: Ambulatory Visit | Attending: Family Medicine | Admitting: Family Medicine

## 2020-09-08 ENCOUNTER — Encounter: Payer: Self-pay | Admitting: Family Medicine

## 2020-09-08 ENCOUNTER — Ambulatory Visit (INDEPENDENT_AMBULATORY_CARE_PROVIDER_SITE_OTHER): Payer: BC Managed Care – PPO | Admitting: Family Medicine

## 2020-09-08 ENCOUNTER — Other Ambulatory Visit: Payer: Self-pay

## 2020-09-08 VITALS — BP 146/82 | HR 81 | Temp 96.7°F | Ht 73.0 in | Wt 284.4 lb

## 2020-09-08 DIAGNOSIS — E782 Mixed hyperlipidemia: Secondary | ICD-10-CM | POA: Diagnosis not present

## 2020-09-08 DIAGNOSIS — R351 Nocturia: Secondary | ICD-10-CM | POA: Diagnosis not present

## 2020-09-08 DIAGNOSIS — N401 Enlarged prostate with lower urinary tract symptoms: Secondary | ICD-10-CM

## 2020-09-08 DIAGNOSIS — E7800 Pure hypercholesterolemia, unspecified: Secondary | ICD-10-CM

## 2020-09-08 DIAGNOSIS — R030 Elevated blood-pressure reading, without diagnosis of hypertension: Secondary | ICD-10-CM

## 2020-09-08 DIAGNOSIS — N5201 Erectile dysfunction due to arterial insufficiency: Secondary | ICD-10-CM | POA: Diagnosis not present

## 2020-09-08 DIAGNOSIS — N452 Orchitis: Secondary | ICD-10-CM

## 2020-09-08 DIAGNOSIS — Z23 Encounter for immunization: Secondary | ICD-10-CM

## 2020-09-08 DIAGNOSIS — Z113 Encounter for screening for infections with a predominantly sexual mode of transmission: Secondary | ICD-10-CM | POA: Diagnosis not present

## 2020-09-08 DIAGNOSIS — J301 Allergic rhinitis due to pollen: Secondary | ICD-10-CM

## 2020-09-08 DIAGNOSIS — R972 Elevated prostate specific antigen [PSA]: Secondary | ICD-10-CM

## 2020-09-08 DIAGNOSIS — I2510 Atherosclerotic heart disease of native coronary artery without angina pectoris: Secondary | ICD-10-CM

## 2020-09-08 LAB — COMPREHENSIVE METABOLIC PANEL
ALT: 21 U/L (ref 0–53)
AST: 16 U/L (ref 0–37)
Albumin: 3.9 g/dL (ref 3.5–5.2)
Alkaline Phosphatase: 44 U/L (ref 39–117)
BUN: 12 mg/dL (ref 6–23)
CO2: 29 mEq/L (ref 19–32)
Calcium: 9.5 mg/dL (ref 8.4–10.5)
Chloride: 104 mEq/L (ref 96–112)
Creatinine, Ser: 0.96 mg/dL (ref 0.40–1.50)
GFR: 85.05 mL/min (ref >60.00)
Glucose, Bld: 100 mg/dL — ABNORMAL HIGH (ref 70–99)
Potassium: 3.8 mEq/L (ref 3.5–5.1)
Sodium: 141 mEq/L (ref 135–145)
Total Bilirubin: 0.5 mg/dL (ref 0.2–1.2)
Total Protein: 6.8 g/dL (ref 6.0–8.3)

## 2020-09-08 LAB — URINALYSIS, ROUTINE W REFLEX MICROSCOPIC
Bilirubin Urine: NEGATIVE
Hgb urine dipstick: NEGATIVE
Ketones, ur: NEGATIVE
Leukocytes,Ua: NEGATIVE
Nitrite: NEGATIVE
RBC / HPF: NONE SEEN (ref 0–?)
Specific Gravity, Urine: 1.02 (ref 1.000–1.030)
Total Protein, Urine: NEGATIVE
Urine Glucose: NEGATIVE
Urobilinogen, UA: 0.2 (ref 0.0–1.0)
pH: 5.5 (ref 5.0–8.0)

## 2020-09-08 LAB — LIPID PANEL
Cholesterol: 175 mg/dL (ref 0–200)
HDL: 65.5 mg/dL (ref >39.00)
LDL Cholesterol: 96 mg/dL (ref 0–99)
NonHDL: 109.02
Total CHOL/HDL Ratio: 3
Triglycerides: 63 mg/dL (ref 0.0–149.0)
VLDL: 12.6 mg/dL (ref 0.0–40.0)

## 2020-09-08 LAB — CBC
HCT: 43.5 % (ref 39.0–52.0)
Hemoglobin: 14.2 g/dL (ref 13.0–17.0)
MCHC: 32.6 g/dL (ref 30.0–36.0)
MCV: 87.9 fl (ref 78.0–100.0)
Platelets: 175 10*3/uL (ref 150.0–400.0)
RBC: 4.95 Mil/uL (ref 4.22–5.81)
RDW: 14.7 % (ref 11.5–15.5)
WBC: 3.3 10*3/uL — ABNORMAL LOW (ref 4.0–10.5)

## 2020-09-08 LAB — LDL CHOLESTEROL, DIRECT: Direct LDL: 108 mg/dL

## 2020-09-08 LAB — PSA: PSA: 4.18 ng/mL — ABNORMAL HIGH (ref 0.10–4.00)

## 2020-09-08 MED ORDER — FLUTICASONE PROPIONATE 50 MCG/ACT NA SUSP: 2.0000 | Freq: Every day | NASAL | 6 refills | Status: DC

## 2020-09-08 MED ORDER — TAMSULOSIN HCL 0.4 MG PO CAPS: 0.4000 mg | ORAL_CAPSULE | Freq: Every day | ORAL | 3 refills | Status: DC

## 2020-09-08 MED ORDER — SULFAMETHOXAZOLE-TRIMETHOPRIM 800-160 MG PO TABS: 1.0000 | ORAL_TABLET | Freq: Two times a day (BID) | ORAL | 0 refills | Status: AC

## 2020-09-08 MED ORDER — SILDENAFIL CITRATE 20 MG PO TABS: ORAL_TABLET | ORAL | 1 refills | Status: DC

## 2020-09-08 NOTE — Patient Instructions (Addendum)
How to Take Your Blood Pressure Blood pressure is a measurement of how strongly your blood is pressing against the walls of your arteries. Arteries are blood vessels that carry blood from your heart throughout your body. Your health care provider takes your blood pressure at each office visit. You can also take your own blood pressure at home with a blood pressure monitor. You may need to take your own blood pressure to:  Confirm a diagnosis of high blood pressure (hypertension).  Monitor your blood pressure over time.  Make sure your blood pressure medicine is working. Supplies needed:  Blood pressure monitor.  Dining room chair to sit in.  Table or desk.  Small notebook and pencil or pen. How to prepare To get the most accurate reading, avoid the following for 30 minutes before you check your blood pressure:  Drinking caffeine.  Drinking alcohol.  Eating.  Smoking.  Exercising. Five minutes before you check your blood pressure:  Use the bathroom and urinate so that you have an empty bladder.  Sit quietly in a dining room chair. Do not sit in a soft couch or an armchair. Do not talk. How to take your blood pressure To check your blood pressure, follow the instructions in the manual that came with your blood pressure monitor. If you have a digital blood pressure monitor, the instructions may be as follows: 1. Sit up straight in a chair. 2. Place your feet on the floor. Do not cross your ankles or legs. 3. Rest your left arm at the level of your heart on a table or desk or on the arm of a chair. 4. Pull up your shirt sleeve. 5. Wrap the blood pressure cuff around the upper part of your left arm, 1 inch (2.5 cm) above your elbow. It is best to wrap the cuff around bare skin. 6. Fit the cuff snugly around your arm. You should be able to place only one finger between the cuff and your arm. 7. Position the cord so that it rests in the bend of your elbow. 8. Press the power  button. 9. Sit quietly while the cuff inflates and deflates. 10. Read the digital reading on the monitor screen and write the numbers down (record them) in a notebook. 11. Wait 2-3 minutes, then repeat the steps, starting at step 1.   What does my blood pressure reading mean? A blood pressure reading consists of a higher number over a lower number. Ideally, your blood pressure should be below 120/80. The first ("top") number is called the systolic pressure. It is a measure of the pressure in your arteries as your heart beats. The second ("bottom") number is called the diastolic pressure. It is a measure of the pressure in your arteries as the heart relaxes. Blood pressure is classified into five stages. The following are the stages for adults who do not have a short-term serious illness or a chronic condition. Systolic pressure and diastolic pressure are measured in a unit called mm Hg (millimeters of mercury).  Normal  Systolic pressure: below 120.  Diastolic pressure: below 80. Elevated  Systolic pressure: 120-129.  Diastolic pressure: below 80. Hypertension stage 1  Systolic pressure: 130-139.  Diastolic pressure: 80-89. Hypertension stage 2  Systolic pressure: 140 or above.  Diastolic pressure: 90 or above. You can have elevated blood pressure or hypertension even if only the systolic or only the diastolic number in your reading is higher than normal. Follow these instructions at home:  Check your blood   pressure as often as recommended by your health care provider.  Check your blood pressure at the same time every day.  Take your monitor to the next appointment with your health care provider to make sure that: ? You are using it correctly. ? It provides accurate readings.  Be sure you understand what your goal blood pressure numbers are.  Tell your health care provider if you are having any side effects from blood pressure medicine.  Keep all follow-up visits as told by  your health care provider. This is important. General tips  Your health care provider can suggest a reliable monitor that will meet your needs. There are several types of home blood pressure monitors.  Choose a monitor that has an arm cuff. Do not choose a monitor that measures your blood pressure from your wrist or finger.  Choose a cuff that wraps snugly around your upper arm. You should be able to fit only one finger between your arm and the cuff.  You can buy a blood pressure monitor at most drugstores or online. Where to find more information American Heart Association: www.heart.org Contact a health care provider if:  Your blood pressure is consistently high. Get help right away if:  Your systolic blood pressure is higher than 180.  Your diastolic blood pressure is higher than 120. Summary  Blood pressure is a measurement of how strongly your blood is pressing against the walls of your arteries.  A blood pressure reading consists of a higher number over a lower number. Ideally, your blood pressure should be below 120/80.  Check your blood pressure at the same time every day.  Avoid caffeine, alcohol, smoking, and exercise for 30 minutes prior to checking your blood pressure. These agents can affect the accuracy of the blood pressure reading. This information is not intended to replace advice given to you by your health care provider. Make sure you discuss any questions you have with your health care provider. Document Revised: 05/01/2019 Document Reviewed: 05/01/2019 Elsevier Patient Education  2021 [place].  Preventing High Cholesterol Cholesterol is a white, waxy substance similar to fat that the human body needs to help build cells. The liver makes all the cholesterol that a person's body needs. Having high cholesterol (hypercholesterolemia) increases your risk for heart disease and stroke. Extra or excess cholesterol comes from the food that you eat. High  cholesterol can often be prevented with diet and lifestyle changes. If you already have high cholesterol, you can control it with diet, lifestyle changes, and medicines. How can high cholesterol affect me? If you have high cholesterol, fatty deposits (plaques) may build up on the walls of your blood vessels. The blood vessels that carry blood away from your heart are called arteries. Plaques make the arteries narrower and stiffer. This in turn can:  Restrict or block blood flow and cause blood clots to form.  Increase your risk for heart attack and stroke. What can increase my risk for high cholesterol? This condition is more likely to develop in people who:  Eat foods that are high in saturated fat or cholesterol. Saturated fat is mostly found in foods that come from animal sources.  Are overweight.  Are not getting enough exercise.  Have a family history of high cholesterol (familial hypercholesterolemia). What actions can I take to prevent this? Nutrition  Eat less saturated fat.  Avoid trans fats (partially hydrogenated oils). These are often found in margarine and in some baked goods, fried foods, and snacks bought  in packages.  Avoid precooked or cured meat, such as bacon, sausages, or meat loaves.  Avoid foods and drinks that have added sugars.  Eat more fruits, vegetables, and whole grains.  Choose healthy sources of protein, such as fish, poultry, lean cuts of red meat, beans, peas, lentils, and nuts.  Choose healthy sources of fat, such as: ? Nuts. ? Vegetable oils, especially olive oil. ? Fish that have healthy fats, such as omega-3 fatty acids. These fish include mackerel or salmon.   Lifestyle  Lose weight if you are overweight. Maintaining a healthy body mass index (BMI) can help prevent or control high cholesterol. It can also lower your risk for diabetes and high blood pressure. Ask your health care provider to help you with a diet and exercise plan to lose  weight safely.  Do not use any products that contain nicotine or tobacco, such as cigarettes, e-cigarettes, and chewing tobacco. If you need help quitting, ask your health care provider. Alcohol use  Do not drink alcohol if: ? Your health care provider tells you not to drink. ? You are pregnant, may be pregnant, or are planning to become pregnant.  If you drink alcohol: ? Limit how much you use to:  0-1 drink a day for women.  0-2 drinks a day for men. ? Be aware of how much alcohol is in your drink. In the [place] one drink equals one 12 oz bottle of beer (355 mL), one 5 oz glass of wine (148 mL), or one 1 oz glass of hard liquor (44 mL). Activity  Get enough exercise. Do exercises as told by your health care provider.  Each week, do at least 150 minutes of exercise that takes a medium level of effort (moderate-intensity exercise). This kind of exercise: ? Makes your heart beat faster while allowing you to still be able to talk. ? Can be done in short sessions several times a day or longer sessions a few times a week. For example, on 5 days each week, you could walk fast or ride your bike 3 times a day for 10 minutes each time.   Medicines  Your health care provider may recommend medicines to help lower cholesterol. This may be a medicine to lower the amount of cholesterol that your liver makes. You may need medicine if: ? Diet and lifestyle changes have not lowered your cholesterol enough. ? You have high cholesterol and other risk factors for heart disease or stroke.  Take over-the-counter and prescription medicines only as told by your health care provider. General information  Manage your risk factors for high cholesterol. Talk with your health care provider about all your risk factors and how to lower your risk.  Manage other conditions that you have, such as diabetes or high blood pressure (hypertension).  Have blood tests to check your cholesterol levels at regular points  in time as told by your health care provider.  Keep all follow-up visits as told by your health care provider. This is important. Where to find more information  American Heart Association: www.heart.org  National Heart, Lung, and Blood Institute: https://wilson-eaton.com/ Summary  High cholesterol increases your risk for heart disease and stroke. By keeping your cholesterol level low, you can reduce your risk for these conditions.  High cholesterol can often be prevented with diet and lifestyle changes.  Work with your health care provider to manage your risk factors, and have your blood tested regularly. This information is not intended to replace advice given to  you by your health care provider. Make sure you discuss any questions you have with your health care provider. Document Revised: 02/17/2019 Document Reviewed: 02/17/2019 Elsevier Patient Education  [place] Maintenance, Male Adopting a healthy lifestyle and getting preventive care are important in promoting health and wellness. Ask your health care provider about:  The right schedule for you to have regular tests and exams.  Things you can do on your own to prevent diseases and keep yourself healthy. What should I know about diet, weight, and exercise? Eat a healthy diet  Eat a diet that includes plenty of vegetables, fruits, low-fat dairy products, and lean protein.  Do not eat a lot of foods that are high in solid fats, added sugars, or sodium.   Maintain a healthy weight Body mass index (BMI) is a measurement that can be used to identify possible weight problems. It estimates body fat based on height and weight. Your health care provider can help determine your BMI and help you achieve or maintain a healthy weight. Get regular exercise Get regular exercise. This is one of the most important things you can do for your health. Most adults should:  Exercise for at least 150 minutes each week. The exercise  should increase your heart rate and make you sweat (moderate-intensity exercise).  Do strengthening exercises at least twice a week. This is in addition to the moderate-intensity exercise.  Spend less time sitting. Even light physical activity can be beneficial. Watch cholesterol and blood lipids Have your blood tested for lipids and cholesterol at 62 years of age, then have this test every 5 years. You may need to have your cholesterol levels checked more often if:  Your lipid or cholesterol levels are high.  You are older than 63 years of age.  You are at high risk for heart disease. What should I know about cancer screening? Many types of cancers can be detected early and may often be prevented. Depending on your health history and family history, you may need to have cancer screening at various ages. This may include screening for:  Colorectal cancer.  Prostate cancer.  Skin cancer.  Lung cancer. What should I know about heart disease, diabetes, and high blood pressure? Blood pressure and heart disease  High blood pressure causes heart disease and increases the risk of stroke. This is more likely to develop in people who have high blood pressure readings, are of African descent, or are overweight.  Talk with your health care provider about your target blood pressure readings.  Have your blood pressure checked: ? Every 3-5 years if you are 21-67 years of age. ? Every year if you are 75 years old or older.  If you are between the ages of 32 and 20 and are a current or former smoker, ask your health care provider if you should have a one-time screening for abdominal aortic aneurysm (AAA). Diabetes Have regular diabetes screenings. This checks your fasting blood sugar level. Have the screening done:  Once every three years after age 58 if you are at a normal weight and have a low risk for diabetes.  More often and at a younger age if you are overweight or have a high risk for  diabetes. What should I know about preventing infection? Hepatitis B If you have a higher risk for hepatitis B, you should be screened for this virus. Talk with your health care provider to find out if you are at risk for hepatitis B  infection. Hepatitis C Blood testing is recommended for:  Everyone born from [birth year] through 1967.  Anyone with known risk factors for hepatitis C. Sexually transmitted infections (STIs)  You should be screened each year for STIs, including gonorrhea and chlamydia, if: ? You are sexually active and are younger than 62 years of age. ? You are older than 62 years of age and your health care provider tells you that you are at risk for this type of infection. ? Your sexual activity has changed since you were last screened, and you are at increased risk for chlamydia or gonorrhea. Ask your health care provider if you are at risk.  Ask your health care provider about whether you are at high risk for HIV. Your health care provider may recommend a prescription medicine to help prevent HIV infection. If you choose to take medicine to prevent HIV, you should first get tested for HIV. You should then be tested every 3 months for as long as you are taking the medicine. Follow these instructions at home: Lifestyle  Do not use any products that contain nicotine or tobacco, such as cigarettes, e-cigarettes, and chewing tobacco. If you need help quitting, ask your health care provider.  Do not use street drugs.  Do not share needles.  Ask your health care provider for help if you need support or information about quitting drugs. Alcohol use  Do not drink alcohol if your health care provider tells you not to drink.  If you drink alcohol: ? Limit how much you have to 0-2 drinks a day. ? Be aware of how much alcohol is in your drink. In the [place] one drink equals one 12 oz bottle of beer (355 mL), one 5 oz glass of wine (148 mL), or one 1 oz glass of hard liquor (44  mL). General instructions  Schedule regular health, dental, and eye exams.  Stay current with your vaccines.  Tell your health care provider if: ? You often feel depressed. ? You have ever been abused or do not feel safe at home. Summary  Adopting a healthy lifestyle and getting preventive care are important in promoting health and wellness.  Follow your health care provider's instructions about healthy diet, exercising, and getting tested or screened for diseases.  Follow your health care provider's instructions on monitoring your cholesterol and blood pressure. This information is not intended to replace advice given to you by your health care provider. Make sure you discuss any questions you have with your health care provider. Document Revised: 04/30/2018 Document Reviewed: 04/30/2018 Elsevier Patient Education  2021 [employer] Inc.  Preventive Care 25-72 Years Old, Male Preventive care refers to lifestyle choices and visits with your health care provider that can promote health and wellness. This includes:  A yearly physical exam. This is also called an annual wellness visit.  Regular dental and eye exams.  Immunizations.  Screening for certain conditions.  Healthy lifestyle choices, such as: ? Eating a healthy diet. ? Getting regular exercise. ? Not using drugs or products that contain nicotine and tobacco. ? Limiting alcohol use. What can I expect for my preventive care visit? Physical exam Your health care provider will check your:  Height and weight. These may be used to calculate your BMI (body mass index). BMI is a measurement that tells if you are at a healthy weight.  Heart rate and blood pressure.  Body temperature.  Skin for abnormal spots. Counseling Your health care provider may ask you questions about your:  Past medical problems.  Family's medical history.  Alcohol, tobacco, and drug use.  Emotional well-being.  Home life and relationship  well-being.  Sexual activity.  Diet, exercise, and sleep habits.  Work and work Statistician.  Access to firearms. What immunizations do I need? Vaccines are usually given at various ages, according to a schedule. Your health care provider will recommend vaccines for you based on your age, medical history, and lifestyle or other factors, such as travel or where you work.   What tests do I need? Blood tests  Lipid and cholesterol levels. These may be checked every 5 years, or more often if you are over 43 years old.  Hepatitis C test.  Hepatitis B test. Screening  Lung cancer screening. You may have this screening every year starting at age 30 if you have a 30-pack-year history of smoking and currently smoke or have quit within the past 15 years.  Prostate cancer screening. Recommendations will vary depending on your family history and other risks.  Genital exam to check for testicular cancer or hernias.  Colorectal cancer screening. ? All adults should have this screening starting at age 12 and continuing until age 78. ? Your health care provider may recommend screening at age 29 if you are at increased risk. ? You will have tests every 1-10 years, depending on your results and the type of screening test.  Diabetes screening. ? This is done by checking your blood sugar (glucose) after you have not eaten for a while (fasting). ? You may have this done every 1-3 years.  STD (sexually transmitted disease) testing, if you are at risk. Follow these instructions at home: Eating and drinking  Eat a diet that includes fresh fruits and vegetables, whole grains, lean protein, and low-fat dairy products.  Take vitamin and mineral supplements as recommended by your health care provider.  Do not drink alcohol if your health care provider tells you not to drink.  If you drink alcohol: ? Limit how much you have to 0-2 drinks a day. ? Be aware of how much alcohol is in your drink. In  the [place] one drink equals one 12 oz bottle of beer (355 mL), one 5 oz glass of wine (148 mL), or one 1 oz glass of hard liquor (44 mL).   Lifestyle  Take daily care of your teeth and gums. Brush your teeth every morning and night with fluoride toothpaste. Floss one time each day.  Stay active. Exercise for at least 30 minutes 5 or more days each week.  Do not use any products that contain nicotine or tobacco, such as cigarettes, e-cigarettes, and chewing tobacco. If you need help quitting, ask your health care provider.  Do not use drugs.  If you are sexually active, practice safe sex. Use a condom or other form of protection to prevent STIs (sexually transmitted infections).  If told by your health care provider, take low-dose aspirin daily starting at age 70.  Find healthy ways to cope with stress, such as: ? Meditation, yoga, or listening to music. ? Journaling. ? Talking to a trusted person. ? Spending time with friends and family. Safety  Always wear your seat belt while driving or riding in a vehicle.  Do not drive: ? If you have been drinking alcohol. Do not ride with someone who has been drinking. ? When you are tired or distracted. ? While texting.  Wear a helmet and other protective equipment during sports activities.  If you have firearms  in your house, make sure you follow all gun safety procedures. What's next?  Go to your health care provider once a year for an annual wellness visit.  Ask your health care provider how often you should have your eyes and teeth checked.  Stay up to date on all vaccines. This information is not intended to replace advice given to you by your health care provider. Make sure you discuss any questions you have with your health care provider. Document Revised: 02/03/2019 Document Reviewed: 05/01/2018 Elsevier Patient Education  2021 [employer].

## 2020-09-08 NOTE — Progress Notes (Signed)
Established Patient Office Visit  Subjective:  Patient ID: Clifford Newman, male    DOB: 30-Jun-1960  Age: 60 y.o. MRN: [id number]  CC:  Chief Complaint  Patient presents with  . Annual Exam    CPE, no concerns patient fasting for labs.     HPI BENIGNO CHECK presents for a physical exam and follow-up of elevated cholesterol, elevated PSA and erectile dysfunction.  ED is responded well to generic sildenafil.  Status post increase of atorvastatin last visit.  He has tolerated the drug well.  Diagnosed with coronary artery 3 calcifications on a screening CT status post tobacco exposure.  Fortunately he is quit smoking for 2 years now.  Blood pressure elevated today.  He was playing basketball just before he came over here today.  Blood pressure has been mostly normal in the past.  He has been able to lose some weight.  He reports BPH symptoms with decreasing urine flow frequency nocturia x2.  Testicles have been sore over the last week or so.  Denies discharge or dysuria.  Past Medical History:  Diagnosis Date  . Allergy    seasonal  . Arthritis    bilateral feet  . Colon polyp 2001   Dr Fuller Plan  . Hyperlipidemia     Past Surgical History:  Procedure Laterality Date  . COLONOSCOPY  2006 & 2011   negative, Dr Fuller Plan  . COLONOSCOPY W/ POLYPECTOMY  2001  . CYSTECTOMY     Ganglion cyst-left wrist   . HAND SURGERY  1983   Right thumb surgery   . ROTATOR CUFF REPAIR Right   . WISDOM TOOTH EXTRACTION      Family History  Problem Relation Age of Onset  . Colon cancer Father 51  . Diabetes Father        IDDM  . Colon polyps Father   . Stroke Paternal Grandfather        >55  . Stroke Paternal Grandmother        > 65  . Colon cancer Paternal Uncle   . Colon cancer Paternal Uncle   . Arthritis Mother   . Heart disease Neg Hx   . Esophageal cancer Neg Hx   . Rectal cancer Neg Hx   . Stomach cancer Neg Hx     Social History   Socioeconomic History  . Marital status: Married     Spouse name: Not on file  . Number of children: Not on file  . Years of education: Not on file  . Highest education level: Not on file  Occupational History  . Not on file  Tobacco Use  . Smoking status: Former Smoker    Packs/day: 1.50    Years: 37.00    Pack years: 55.50    Types: Cigarettes    Quit date: 12/20/2014    Years since quitting: 5.7  . Smokeless tobacco: Former Network engineer  . Vaping Use: Never used  Substance and Sexual Activity  . Alcohol use: Yes    Comment: 4 shots per week  . Drug use: No  . Sexual activity: Yes  Other Topics Concern  . Not on file  Social History Narrative  . Not on file   Social Determinants of Health   Financial Resource Strain: Not on file  Food Insecurity: Not on file  Transportation Needs: Not on file  Physical Activity: Not on file  Stress: Not on file  Social Connections: Not on file  Intimate Partner Violence: Not  on file    Outpatient Medications Prior to Visit  Medication Sig Dispense Refill  . aspirin EC 81 MG tablet Take 1 tablet (81 mg total) by mouth daily. 365 tablet 3  . atorvastatin (LIPITOR) 40 MG tablet TAKE 1 TABLET BY MOUTH EVERY DAY 90 tablet 3  . fluticasone (FLONASE) 50 MCG/ACT nasal spray Place 2 sprays into both nostrils daily. 16 g 6  . sildenafil (REVATIO) 20 MG tablet Take two to five daily 45 minutes prior as needed. 50 tablet 1  . diclofenac (VOLTAREN) 50 MG EC tablet Take 50 mg by mouth 2 (two) times daily as needed. (Patient not taking: No sig reported)     No facility-administered medications prior to visit.    No Known Allergies  ROS Review of Systems  Constitutional: Negative.   HENT: Negative.   Eyes: Negative for photophobia and visual disturbance.  Respiratory: Negative.   Cardiovascular: Negative.   Gastrointestinal: Negative.   Endocrine: Negative for polyphagia and polyuria.  Genitourinary: Positive for difficulty urinating, frequency and testicular pain. Negative for  decreased urine volume, dysuria and penile discharge.  Musculoskeletal: Negative for gait problem.  Skin: Negative for color change and pallor.  Neurological: Negative for speech difficulty and weakness.  Hematological: Does not bruise/bleed easily.  Psychiatric/Behavioral: Negative.       Objective:    Physical Exam Vitals and nursing note reviewed.  Constitutional:      General: He is not in acute distress.    Appearance: Normal appearance. He is not ill-appearing, toxic-appearing or diaphoretic.  HENT:     Head: Normocephalic and atraumatic.     Right Ear: Tympanic membrane, ear canal and external ear normal.     Left Ear: Tympanic membrane, ear canal and external ear normal.     Mouth/Throat:     Mouth: Mucous membranes are moist.     Pharynx: Oropharynx is clear. No oropharyngeal exudate or posterior oropharyngeal erythema.  Eyes:     General: No scleral icterus.       Right eye: No discharge.        Left eye: No discharge.     Extraocular Movements: Extraocular movements intact.     Conjunctiva/sclera: Conjunctivae normal.     Pupils: Pupils are equal, round, and reactive to light.  Neck:     Vascular: No carotid bruit.  Cardiovascular:     Rate and Rhythm: Normal rate and regular rhythm.  Pulmonary:     Effort: Pulmonary effort is normal.     Breath sounds: Normal breath sounds.  Abdominal:     General: Abdomen is flat. Bowel sounds are normal. There is no distension.     Palpations: There is no mass.     Tenderness: There is no abdominal tenderness. There is no guarding or rebound.     Hernia: No hernia is present. There is no hernia in the left inguinal area or right inguinal area.  Genitourinary:    Penis: Circumcised. No hypospadias, erythema, tenderness, discharge, swelling or lesions.      Testes:        Right: Tenderness present. Mass or swelling not present. Right testis is descended.        Left: Tenderness present. Mass or swelling not present. Left  testis is descended.     Epididymis:     Right: Not inflamed.     Left: Not inflamed.     Prostate: Enlarged. Not tender and no nodules present.     Rectum: Guaiac result negative.  No mass, tenderness, anal fissure, external hemorrhoid or internal hemorrhoid. Normal anal tone.  Musculoskeletal:     Cervical back: No rigidity or tenderness.  Lymphadenopathy:     Cervical: No cervical adenopathy.     Lower Body: No right inguinal adenopathy. No left inguinal adenopathy.  Skin:    General: Skin is warm and dry.  Neurological:     Mental Status: He is alert and oriented to person, place, and time.  Psychiatric:        Mood and Affect: Mood normal.        Behavior: Behavior normal.     BP (!) 146/82   Pulse 81   Temp (!) 96.7 F (35.9 C) (Temporal)   Ht 6\' 1"  (1.854 m)   Wt 284 lb 6.4 oz (129 kg)   SpO2 96%   BMI 37.52 kg/m  Wt Readings from Last 3 Encounters:  09/08/20 284 lb 6.4 oz (129 kg)  12/21/19 (!) 289 lb (131.1 kg)  12/07/19 289 lb (131.1 kg)   Vitals:   09/08/20 1035  BP: (!) 146/82     Health Maintenance Due  Topic Date Due  . TETANUS/TDAP  01/22/2019    There are no preventive care reminders to display for this patient.  Lab Results  Component Value Date   TSH 1.85 08/12/2017   Lab Results  Component Value Date   WBC 3.9 (L) 07/24/2019   HGB 14.4 07/24/2019   HCT 43.2 07/24/2019   MCV 89.2 07/24/2019   PLT 206.0 07/24/2019   Lab Results  Component Value Date   NA 140 07/24/2019   K 4.1 07/24/2019   CO2 29 07/24/2019   GLUCOSE 104 (H) 07/24/2019   BUN 14 07/24/2019   CREATININE 1.01 07/24/2019   BILITOT 0.5 07/24/2019   ALKPHOS 42 07/24/2019   AST 15 07/24/2019   ALT 21 07/24/2019   PROT 7.2 07/24/2019   ALBUMIN 4.1 07/24/2019   CALCIUM 9.7 07/24/2019   GFR 90.92 07/24/2019   Lab Results  Component Value Date   CHOL 185 07/24/2019   Lab Results  Component Value Date   HDL 71.20 07/24/2019   Lab Results  Component Value Date    LDLCALC 101 (H) 07/24/2019   Lab Results  Component Value Date   TRIG 66.0 07/24/2019   Lab Results  Component Value Date   CHOLHDL 3 07/24/2019   Lab Results  Component Value Date   HGBA1C 6.5 07/24/2019      Assessment & Plan:   Problem List Items Addressed This Visit      Cardiovascular and Mediastinum   Erectile dysfunction due to arterial insufficiency   Relevant Medications   sildenafil (REVATIO) 20 MG tablet   Coronary artery calcification seen on CAT scan   Relevant Medications   sildenafil (REVATIO) 20 MG tablet     Respiratory   Allergic rhinitis due to pollen   Relevant Medications   fluticasone (FLONASE) 50 MCG/ACT nasal spray     Genitourinary   Orchitis   Relevant Medications   sulfamethoxazole-trimethoprim (BACTRIM DS) 800-160 MG tablet   Other Relevant Orders   Urine cytology ancillary only     Other   Mixed hyperlipidemia   Relevant Medications   sildenafil (REVATIO) 20 MG tablet   Other Relevant Orders   Comprehensive metabolic panel   LDL cholesterol, direct   Lipid panel   Elevated LDL cholesterol level   Relevant Orders   Comprehensive metabolic panel   LDL cholesterol, direct  Lipid panel   Elevated PSA, less than 10 ng/ml   Relevant Orders   PSA   Elevated blood-pressure reading without diagnosis of hypertension   Relevant Orders   CBC   Comprehensive metabolic panel   Urinalysis, Routine w reflex microscopic   Need for Tdap vaccination - Primary   Relevant Orders   Tdap vaccine greater than or equal to 7yo IM    Other Visit Diagnoses    Benign prostatic hyperplasia with nocturia       Relevant Medications   tamsulosin (FLOMAX) 0.4 MG CAPS capsule   Other Relevant Orders   PSA      Meds ordered this encounter  Medications  . fluticasone (FLONASE) 50 MCG/ACT nasal spray    Sig: Place 2 sprays into both nostrils daily.    Dispense:  16 g    Refill:  6  . sildenafil (REVATIO) 20 MG tablet    Sig: Take two to five  daily 45 minutes prior as needed.    Dispense:  50 tablet    Refill:  1  . tamsulosin (FLOMAX) 0.4 MG CAPS capsule    Sig: Take 1 capsule (0.4 mg total) by mouth daily.    Dispense:  30 capsule    Refill:  3  . sulfamethoxazole-trimethoprim (BACTRIM DS) 800-160 MG tablet    Sig: Take 1 tablet by mouth 2 (two) times daily for 10 days.    Dispense:  20 tablet    Refill:  0    Follow-up: Return in about 2 months (around 11/08/2020), or check and record blood pressures..  Patient was given information on health maintenance and disease prevention as well as how to take his blood pressure prevent elevated cholesterol.  Discussed at length his coronary calcifications and their association with coronary artery disease.  He will check his blood pressure regularly 2-3 times weekly and follow-up in 2 months.  Starting patient on tamsulosin for BPH symptoms and Septra for orchitis.  Libby Maw, MD

## 2020-09-09 LAB — URINE CYTOLOGY ANCILLARY ONLY
Chlamydia: NEGATIVE
Comment: NEGATIVE
Comment: NORMAL
Neisseria Gonorrhea: NEGATIVE

## 2020-10-19 ENCOUNTER — Other Ambulatory Visit: Payer: Self-pay | Admitting: Family Medicine

## 2020-10-19 DIAGNOSIS — R351 Nocturia: Secondary | ICD-10-CM

## 2020-10-19 DIAGNOSIS — N401 Enlarged prostate with lower urinary tract symptoms: Secondary | ICD-10-CM

## 2020-11-10 ENCOUNTER — Encounter: Payer: Self-pay | Admitting: Family Medicine

## 2020-11-10 ENCOUNTER — Ambulatory Visit: Payer: BC Managed Care – PPO | Admitting: Family Medicine

## 2020-11-10 ENCOUNTER — Other Ambulatory Visit: Payer: Self-pay

## 2020-11-10 VITALS — BP 144/82 | HR 85 | Temp 97.0°F | Ht 73.0 in | Wt 285.4 lb

## 2020-11-10 DIAGNOSIS — R972 Elevated prostate specific antigen [PSA]: Secondary | ICD-10-CM | POA: Diagnosis not present

## 2020-11-10 DIAGNOSIS — B079 Viral wart, unspecified: Secondary | ICD-10-CM

## 2020-11-10 DIAGNOSIS — I10 Essential (primary) hypertension: Secondary | ICD-10-CM | POA: Diagnosis not present

## 2020-11-10 DIAGNOSIS — E6609 Other obesity due to excess calories: Secondary | ICD-10-CM | POA: Insufficient documentation

## 2020-11-10 DIAGNOSIS — E782 Mixed hyperlipidemia: Secondary | ICD-10-CM

## 2020-11-10 DIAGNOSIS — Z6837 Body mass index (BMI) 37.0-37.9, adult: Secondary | ICD-10-CM | POA: Diagnosis not present

## 2020-11-10 LAB — VITAMIN D 25 HYDROXY (VIT D DEFICIENCY, FRACTURES): VITD: 17.85 ng/mL — ABNORMAL LOW (ref 30.00–100.00)

## 2020-11-10 LAB — COMPREHENSIVE METABOLIC PANEL
ALT: 15 U/L (ref 0–53)
AST: 14 U/L (ref 0–37)
Albumin: 4.1 g/dL (ref 3.5–5.2)
Alkaline Phosphatase: 38 U/L — ABNORMAL LOW (ref 39–117)
BUN: 19 mg/dL (ref 6–23)
CO2: 25 mEq/L (ref 19–32)
Calcium: 9.5 mg/dL (ref 8.4–10.5)
Chloride: 105 mEq/L (ref 96–112)
Creatinine, Ser: 0.94 mg/dL (ref 0.40–1.50)
GFR: 87.12 mL/min (ref >60.00)
Glucose, Bld: 106 mg/dL — ABNORMAL HIGH (ref 70–99)
Potassium: 4.1 mEq/L (ref 3.5–5.1)
Sodium: 139 mEq/L (ref 135–145)
Total Bilirubin: 0.5 mg/dL (ref 0.2–1.2)
Total Protein: 6.9 g/dL (ref 6.0–8.3)

## 2020-11-10 LAB — LIPID PANEL
Cholesterol: 185 mg/dL (ref 0–200)
HDL: 74.9 mg/dL (ref >39.00)
LDL Cholesterol: 100 mg/dL — ABNORMAL HIGH (ref 0–99)
NonHDL: 110.5
Total CHOL/HDL Ratio: 2
Triglycerides: 55 mg/dL (ref 0.0–149.0)
VLDL: 11 mg/dL (ref 0.0–40.0)

## 2020-11-10 MED ORDER — AMLODIPINE BESYLATE 5 MG PO TABS
5.0000 mg | ORAL_TABLET | Freq: Every day | ORAL | 1 refills | Status: DC
Start: 2020-11-10 — End: 2021-02-10

## 2020-11-10 NOTE — Progress Notes (Signed)
Established Patient Office Visit  Subjective:  Patient ID: Clifford Newman, male    DOB: 07/22/1960  Age: 60 y.o. MRN: [id number]  CC:  Chief Complaint  Patient presents with   Follow-up    Follow up on BP and cholesterol, help with weight loss.     HPI Clifford Newman presents for follow-up of blood pressure, obesity and has a lesion on his right lower back removed if possible.  Patient has found it difficult to lose weight even though he has been trying decreased caloric intake and increased exercise.  Would be interested in having some help with this matter.  Blood pressure remains elevated.  He is having no issues taking higher dose of atorvastatin.  Past Medical History:  Diagnosis Date   Allergy    seasonal   Arthritis    bilateral feet   Colon polyp 2001   Dr Fuller Plan   Hyperlipidemia     Past Surgical History:  Procedure Laterality Date   COLONOSCOPY  2006 & 2011   negative, Dr Fuller Plan   COLONOSCOPY W/ POLYPECTOMY  2001   CYSTECTOMY     Ganglion cyst-left wrist    HAND SURGERY  1983   Right thumb surgery    ROTATOR CUFF REPAIR Right    WISDOM TOOTH EXTRACTION      Family History  Problem Relation Age of Onset   Colon cancer Father 27   Diabetes Father        IDDM   Colon polyps Father    Stroke Paternal Grandfather        >55   Stroke Paternal Grandmother        > 75   Colon cancer Paternal Uncle    Colon cancer Paternal Uncle    Arthritis Mother    Heart disease Neg Hx    Esophageal cancer Neg Hx    Rectal cancer Neg Hx    Stomach cancer Neg Hx     Social History   Socioeconomic History   Marital status: Married    Spouse name: Not on file   Number of children: Not on file   Years of education: Not on file   Highest education level: Not on file  Occupational History   Not on file  Tobacco Use   Smoking status: Former    Packs/day: 1.50    Years: 37.00    Pack years: 55.50    Types: Cigarettes    Quit date: 12/20/2014    Years since  quitting: 5.8   Smokeless tobacco: Former  Scientific laboratory technician Use: Never used  Substance and Sexual Activity   Alcohol use: Yes    Comment: 4 shots per week   Drug use: No   Sexual activity: Yes  Other Topics Concern   Not on file  Social History Narrative   Not on file   Social Determinants of Health   Financial Resource Strain: Not on file  Food Insecurity: Not on file  Transportation Needs: Not on file  Physical Activity: Not on file  Stress: Not on file  Social Connections: Not on file  Intimate Partner Violence: Not on file    Outpatient Medications Prior to Visit  Medication Sig Dispense Refill   aspirin EC 81 MG tablet Take 1 tablet (81 mg total) by mouth daily. 365 tablet 3   atorvastatin (LIPITOR) 40 MG tablet TAKE 1 TABLET BY MOUTH EVERY DAY 90 tablet 3   fluticasone (FLONASE) 50 MCG/ACT nasal spray Place  2 sprays into both nostrils daily. 16 g 6   tamsulosin (FLOMAX) 0.4 MG CAPS capsule TAKE 1 CAPSULE BY MOUTH EVERY DAY 90 capsule 1   sildenafil (REVATIO) 20 MG tablet Take two to five daily 45 minutes prior as needed. (Patient not taking: Reported on 11/10/2020) 50 tablet 1   No facility-administered medications prior to visit.    No Known Allergies  ROS Review of Systems  Constitutional: Negative.   HENT: Negative.    Respiratory: Negative.    Cardiovascular: Negative.   Gastrointestinal: Negative.   Genitourinary: Negative.   Musculoskeletal: Negative.   Skin:  Negative for color change and wound.  Psychiatric/Behavioral: Negative.       Objective:    Physical Exam Vitals and nursing note reviewed.  Constitutional:      General: He is not in acute distress.    Appearance: Normal appearance. He is obese. He is not ill-appearing, toxic-appearing or diaphoretic.  HENT:     Head: Normocephalic and atraumatic.     Right Ear: External ear normal.     Left Ear: External ear normal.     Mouth/Throat:     Mouth: Mucous membranes are moist.      Pharynx: Oropharynx is clear. No oropharyngeal exudate or posterior oropharyngeal erythema.  Eyes:     General: No scleral icterus.       Right eye: No discharge.        Left eye: No discharge.     Extraocular Movements: Extraocular movements intact.     Conjunctiva/sclera: Conjunctivae normal.     Pupils: Pupils are equal, round, and reactive to light.  Neck:     Vascular: No carotid bruit.  Cardiovascular:     Rate and Rhythm: Normal rate and regular rhythm.  Pulmonary:     Effort: Pulmonary effort is normal.     Breath sounds: Normal breath sounds.  Abdominal:     General: Bowel sounds are normal.  Musculoskeletal:     Cervical back: Normal range of motion and neck supple. No rigidity or tenderness.  Lymphadenopathy:     Cervical: No cervical adenopathy.  Skin:    General: Skin is warm and dry.       Neurological:     Mental Status: He is alert and oriented to person, place, and time.  Psychiatric:        Mood and Affect: Mood normal.        Behavior: Behavior normal.  Shave Biopsy Procedure Note  Pre-operative Diagnosis: wart  Post-operative Diagnosis: normal  Locations:lower trunk  Indications: irritant  Anesthesia: not required without added sodium bicarbonate  Procedure Details  History of allergy to iodine: no  Patient informed of the risks (including bleeding and infection) and benefits of the  procedure and Verbal informed consent obtained.  The lesion and surrounding area were given a sterile prep using alcohol and draped in the usual sterile fashion. 40 second freeze with histofreeze was used to freeze an area of skin approximately  .3 cm by 57mm.  Hemostasis achieved with not required. Antibiotic ointment and a sterile dressing applied.  The specimen was not sent for pathologic examination. The patient tolerated the procedure well.  EBL: 0 ml  Findings: same  Condition: Stable  Complications: none.  Plan: 1. Instructed to keep the wound dry  and covered for 24-48h and clean thereafter. 2. Warning signs of infection were reviewed.   3. Recommended that the patient use  tylenol  as needed for pain.  4.  Return in 1 week.  BP (!) 144/82   Pulse 85   Temp (!) 97 F (36.1 C) (Temporal)   Ht 6\' 1"  (1.854 m)   Wt 285 lb 6.4 oz (129.5 kg)   SpO2 95%   BMI 37.65 kg/m  Wt Readings from Last 3 Encounters:  11/10/20 285 lb 6.4 oz (129.5 kg)  09/08/20 284 lb 6.4 oz (129 kg)  12/21/19 (!) 289 lb (131.1 kg)     Health Maintenance Due  Topic Date Due   Zoster Vaccines- Shingrix (1 of 2) Never done    There are no preventive care reminders to display for this patient.  Lab Results  Component Value Date   TSH 1.85 08/12/2017   Lab Results  Component Value Date   WBC 3.3 (L) 09/08/2020   HGB 14.2 09/08/2020   HCT 43.5 09/08/2020   MCV 87.9 09/08/2020   PLT 175.0 09/08/2020   Lab Results  Component Value Date   NA 141 09/08/2020   K 3.8 09/08/2020   CO2 29 09/08/2020   GLUCOSE 100 (H) 09/08/2020   BUN 12 09/08/2020   CREATININE 0.96 09/08/2020   BILITOT 0.5 09/08/2020   ALKPHOS 44 09/08/2020   AST 16 09/08/2020   ALT 21 09/08/2020   PROT 6.8 09/08/2020   ALBUMIN 3.9 09/08/2020   CALCIUM 9.5 09/08/2020   GFR 85.05 09/08/2020   Lab Results  Component Value Date   CHOL 175 09/08/2020   Lab Results  Component Value Date   HDL 65.50 09/08/2020   Lab Results  Component Value Date   LDLCALC 96 09/08/2020   Lab Results  Component Value Date   TRIG 63.0 09/08/2020   Lab Results  Component Value Date   CHOLHDL 3 09/08/2020   Lab Results  Component Value Date   HGBA1C 6.5 07/24/2019      Assessment & Plan:   Problem List Items Addressed This Visit       Cardiovascular and Mediastinum   Essential hypertension   Relevant Medications   amLODipine (NORVASC) 5 MG tablet   Other Relevant Orders   Comprehensive metabolic panel     Musculoskeletal and Integument   Filiform wart     Other   Mixed  hyperlipidemia   Relevant Medications   amLODipine (NORVASC) 5 MG tablet   Other Relevant Orders   Lipid panel   Elevated PSA, less than 10 ng/ml - Primary   Class 2 obesity due to excess calories with body mass index (BMI) of 37.0 to 37.9 in adult   Relevant Orders   VITAMIN D 25 Hydroxy (Vit-D Deficiency, Fractures)   Amb Ref to Medical Weight Management    Meds ordered this encounter  Medications   amLODipine (NORVASC) 5 MG tablet    Sig: Take 1 tablet (5 mg total) by mouth daily.    Dispense:  90 tablet    Refill:  1    Follow-up: Return in about 3 months (around 02/10/2021).   Agrees to start amlodipine for hypertension.  Discussed side effects including swelling in the lower extremities he will let me know.  Given information on warts Therapy.  He will return with any increase in pain, erythema swelling or discharge.  Agrees to go for consultation with weight loss management.  We will follow-up PSA in 3 months. Libby Maw, MD

## 2020-12-08 ENCOUNTER — Encounter: Payer: Self-pay | Admitting: Acute Care

## 2020-12-19 ENCOUNTER — Other Ambulatory Visit: Payer: Self-pay

## 2020-12-19 ENCOUNTER — Ambulatory Visit (INDEPENDENT_AMBULATORY_CARE_PROVIDER_SITE_OTHER)
Admission: RE | Admit: 2020-12-19 | Discharge: 2020-12-19 | Disposition: A | Discharge status: Home / Self Care | Payer: BC Managed Care – PPO | Source: Ambulatory Visit | Attending: Acute Care | Admitting: Acute Care

## 2020-12-19 DIAGNOSIS — F17210 Nicotine dependence, cigarettes, uncomplicated: Secondary | ICD-10-CM | POA: Diagnosis not present

## 2020-12-29 NOTE — Progress Notes (Signed)
Please call patient and let them  know their  low dose Ct was read as a Lung RADS 2: nodules that are benign in appearance and behavior with a very low likelihood of becoming a clinically active cancer due to size or lack of growth. Recommendation per radiology is for a repeat LDCT in 12 months. .Please let them  know we will order and schedule their  annual screening scan for 12/2021. Please let them  know there was notation of CAD on their  scan.  Please remind the patient  that this is a non-gated exam therefore degree or severity of disease  cannot be determined. Please have them  follow up with their PCP regarding potential risk factor modification, dietary therapy or pharmacologic therapy if clinically indicated. Pt.  is  currently on statin therapy. Please place order for annual  screening scan for  12/2021 and fax results to PCP. Thanks so much.  There is notation of Aortic atherosclerosis and coronary artery calcifications. Please have them follow up with PCP. Thanks so much

## 2021-01-03 ENCOUNTER — Encounter: Payer: Self-pay | Admitting: *Deleted

## 2021-01-03 DIAGNOSIS — F17210 Nicotine dependence, cigarettes, uncomplicated: Secondary | ICD-10-CM

## 2021-01-24 ENCOUNTER — Telehealth: Payer: Self-pay | Admitting: Family Medicine

## 2021-01-24 NOTE — Telephone Encounter (Signed)
Pt's spouse(Pamela) has dropped off paperwork for Dr. Ethelene Hal to fill out. She is needing the height, weight, bp and the tb section filled out. She would not like the date to be changed. Please advise Olin Hauser at [phone], she will pick up paperwork when complete.

## 2021-01-24 NOTE — Telephone Encounter (Signed)
Form filled out ready for pick up, patients wife aware and will pick up today.

## 2021-01-24 NOTE — Telephone Encounter (Signed)
Pt picked up forms for him and his son.

## 2021-01-24 NOTE — Telephone Encounter (Signed)
Bronte stopped by asking about this paper. I let him know they were just dropped off today. He is saying that he and his son are needing this as soon as we can get these to them. It is has to do with adoption.

## 2021-02-10 ENCOUNTER — Encounter: Payer: Self-pay | Admitting: Family Medicine

## 2021-02-10 ENCOUNTER — Ambulatory Visit (INDEPENDENT_AMBULATORY_CARE_PROVIDER_SITE_OTHER): Payer: BC Managed Care – PPO | Admitting: Family Medicine

## 2021-02-10 ENCOUNTER — Other Ambulatory Visit: Payer: Self-pay

## 2021-02-10 VITALS — BP 138/78 | HR 77 | Temp 97.4°F | Resp 16 | Wt 271.6 lb

## 2021-02-10 DIAGNOSIS — E7800 Pure hypercholesterolemia, unspecified: Secondary | ICD-10-CM

## 2021-02-10 DIAGNOSIS — E782 Mixed hyperlipidemia: Secondary | ICD-10-CM

## 2021-02-10 DIAGNOSIS — T887XXA Unspecified adverse effect of drug or medicament, initial encounter: Secondary | ICD-10-CM

## 2021-02-10 DIAGNOSIS — I10 Essential (primary) hypertension: Secondary | ICD-10-CM | POA: Diagnosis not present

## 2021-02-10 DIAGNOSIS — Z0000 Encounter for general adult medical examination without abnormal findings: Secondary | ICD-10-CM

## 2021-02-10 DIAGNOSIS — R7309 Other abnormal glucose: Secondary | ICD-10-CM

## 2021-02-10 DIAGNOSIS — Z23 Encounter for immunization: Secondary | ICD-10-CM | POA: Diagnosis not present

## 2021-02-10 DIAGNOSIS — R972 Elevated prostate specific antigen [PSA]: Secondary | ICD-10-CM | POA: Diagnosis not present

## 2021-02-10 DIAGNOSIS — E6609 Other obesity due to excess calories: Secondary | ICD-10-CM | POA: Diagnosis not present

## 2021-02-10 DIAGNOSIS — Z6837 Body mass index (BMI) 37.0-37.9, adult: Secondary | ICD-10-CM

## 2021-02-10 LAB — CBC
HCT: 43.2 % (ref 39.0–52.0)
Hemoglobin: 14 g/dL (ref 13.0–17.0)
MCHC: 32.3 g/dL (ref 30.0–36.0)
MCV: 89 fl (ref 78.0–100.0)
Platelets: 173 10*3/uL (ref 150.0–400.0)
RBC: 4.86 Mil/uL (ref 4.22–5.81)
RDW: 14.6 % (ref 11.5–15.5)
WBC: 3.2 10*3/uL — ABNORMAL LOW (ref 4.0–10.5)

## 2021-02-10 LAB — COMPREHENSIVE METABOLIC PANEL
ALT: 24 U/L (ref 0–53)
AST: 21 U/L (ref 0–37)
Albumin: 4.2 g/dL (ref 3.5–5.2)
Alkaline Phosphatase: 40 U/L (ref 39–117)
BUN: 20 mg/dL (ref 6–23)
CO2: 27 mEq/L (ref 19–32)
Calcium: 9.4 mg/dL (ref 8.4–10.5)
Chloride: 105 mEq/L (ref 96–112)
Creatinine, Ser: 0.8 mg/dL (ref 0.40–1.50)
GFR: 94.94 mL/min (ref >60.00)
Glucose, Bld: 96 mg/dL (ref 70–99)
Potassium: 4.1 mEq/L (ref 3.5–5.1)
Sodium: 139 mEq/L (ref 135–145)
Total Bilirubin: 0.5 mg/dL (ref 0.2–1.2)
Total Protein: 7 g/dL (ref 6.0–8.3)

## 2021-02-10 LAB — LIPID PANEL
Cholesterol: 174 mg/dL (ref 0–200)
HDL: 67.8 mg/dL (ref >39.00)
LDL Cholesterol: 94 mg/dL (ref 0–99)
NonHDL: 105.98
Total CHOL/HDL Ratio: 3
Triglycerides: 61 mg/dL (ref 0.0–149.0)
VLDL: 12.2 mg/dL (ref 0.0–40.0)

## 2021-02-10 LAB — PSA: PSA: 3.55 ng/mL (ref 0.10–4.00)

## 2021-02-10 LAB — HEMOGLOBIN A1C: Hgb A1c MFr Bld: 6.6 % — ABNORMAL HIGH (ref 4.6–6.5)

## 2021-02-10 MED ORDER — SEMAGLUTIDE (1 MG/DOSE) 4 MG/3ML ~~LOC~~ SOPN: 1.0000 mg | PEN_INJECTOR | SUBCUTANEOUS | 5 refills | Status: DC

## 2021-02-10 MED ORDER — SPIRONOLACTONE 50 MG PO TABS: 50.0000 mg | ORAL_TABLET | Freq: Every day | ORAL | 1 refills | Status: DC

## 2021-02-10 NOTE — Progress Notes (Signed)
Established Patient Office Visit  Subjective:  Patient ID: Clifford Newman, male    DOB: [birth year]  Age: 60 y.o. MRN: [id number]  CC:  Chief Complaint  Patient presents with   Follow-up    3 mo follow up, pt states he thinks blood pressure meds make him feel like he is in a daze, feels like he's floating, lightheadedness, pt would like flu shot     HPI [name] presents for follow-up of hypertension, obesity, elevated cholesterol, elevated PSA and possible medication side effect.  Patient is felt lightheaded since starting the amlodipine.  He has been able to lose 14 pounds by making healthier food choices.  Did not start the vitamin D yet denies any problems with urine flow.  He is fasting for lipid check.  He is interested in trying semaglutide.  Past Medical History:  Diagnosis Date   Allergy    seasonal   Arthritis    bilateral feet   Colon polyp 2001   Dr Fuller Plan   Hyperlipidemia     Past Surgical History:  Procedure Laterality Date   COLONOSCOPY  2006 & 2011   negative, Dr Fuller Plan   COLONOSCOPY W/ POLYPECTOMY  2001   CYSTECTOMY     Ganglion cyst-left wrist    HAND SURGERY  1983   Right thumb surgery    ROTATOR CUFF REPAIR Right    WISDOM TOOTH EXTRACTION      Family History  Problem Relation Age of Onset   Colon cancer Father 45   Diabetes Father        IDDM   Colon polyps Father    Stroke Paternal Grandfather        >55   Stroke Paternal Grandmother        > 19   Colon cancer Paternal Uncle    Colon cancer Paternal Uncle    Arthritis Mother    Heart disease Neg Hx    Esophageal cancer Neg Hx    Rectal cancer Neg Hx    Stomach cancer Neg Hx     Social History   Socioeconomic History   Marital status: Married    Spouse name: Not on file   Number of children: Not on file   Years of education: Not on file   Highest education level: Not on file  Occupational History   Not on file  Tobacco Use   Smoking status: Former    Packs/day: 1.50     Years: 37.00    Pack years: 55.50    Types: Cigarettes    Quit date: 12/20/2014    Years since quitting: 6.1   Smokeless tobacco: Former  Scientific laboratory technician Use: Never used  Substance and Sexual Activity   Alcohol use: Yes    Comment: 4 shots per week   Drug use: No   Sexual activity: Yes  Other Topics Concern   Not on file  Social History Narrative   Not on file   Social Determinants of Health   Financial Resource Strain: Not on file  Food Insecurity: Not on file  Transportation Needs: Not on file  Physical Activity: Not on file  Stress: Not on file  Social Connections: Not on file  Intimate Partner Violence: Not on file    Outpatient Medications Prior to Visit  Medication Sig Dispense Refill   aspirin EC 81 MG tablet Take 1 tablet (81 mg total) by mouth daily. 365 tablet 3   atorvastatin (LIPITOR) 40 MG tablet  TAKE 1 TABLET BY MOUTH EVERY DAY 90 tablet 3   fluticasone (FLONASE) 50 MCG/ACT nasal spray Place 2 sprays into both nostrils daily. 16 g 6   tamsulosin (FLOMAX) 0.4 MG CAPS capsule TAKE 1 CAPSULE BY MOUTH EVERY DAY 90 capsule 1   amLODipine (NORVASC) 5 MG tablet Take 1 tablet (5 mg total) by mouth daily. 90 tablet 1   No facility-administered medications prior to visit.    No Known Allergies  ROS Review of Systems  Constitutional:  Negative for diaphoresis, fatigue, fever and unexpected weight change.  Eyes:  Negative for photophobia and visual disturbance.  Respiratory: Negative.    Cardiovascular: Negative.   Gastrointestinal: Negative.   Genitourinary:  Negative for difficulty urinating, frequency and urgency.  Musculoskeletal:  Negative for gait problem and joint swelling.  Neurological:  Positive for light-headedness. Negative for weakness and headaches.  Psychiatric/Behavioral: Negative.       Objective:    Physical Exam Vitals and nursing note reviewed.  Constitutional:      General: He is not in acute distress.    Appearance: Normal  appearance. He is obese. He is not ill-appearing, toxic-appearing or diaphoretic.  HENT:     Head: Normocephalic and atraumatic.     Right Ear: External ear normal.     Left Ear: External ear normal.  Eyes:     Extraocular Movements: Extraocular movements intact.     Conjunctiva/sclera: Conjunctivae normal.     Pupils: Pupils are equal, round, and reactive to light.  Cardiovascular:     Rate and Rhythm: Normal rate and regular rhythm.  Pulmonary:     Effort: Pulmonary effort is normal.     Breath sounds: Normal breath sounds.  Abdominal:     General: Bowel sounds are normal.  Musculoskeletal:     Cervical back: No rigidity or tenderness.     Right lower leg: No edema.     Left lower leg: No edema.  Lymphadenopathy:     Cervical: No cervical adenopathy.  Skin:    General: Skin is warm and dry.  Neurological:     Mental Status: He is alert and oriented to person, place, and time.  Psychiatric:        Mood and Affect: Mood normal.        Behavior: Behavior normal.    BP 138/78 (BP Location: Left Arm, Patient Position: Sitting, Cuff Size: Large)   Pulse 77   Temp (!) 97.4 F (36.3 C) (Temporal)   Resp 16   Wt 271 lb 9.6 oz (123.2 kg)   SpO2 97%   BMI 35.83 kg/m  Wt Readings from Last 3 Encounters:  02/10/21 271 lb 9.6 oz (123.2 kg)  11/10/20 285 lb 6.4 oz (129.5 kg)  09/08/20 284 lb 6.4 oz (129 kg)     Health Maintenance Due  Topic Date Due   Zoster Vaccines- Shingrix (1 of 2) Never done    There are no preventive care reminders to display for this patient.  Lab Results  Component Value Date   TSH 1.85 08/12/2017   Lab Results  Component Value Date   WBC 3.3 (L) 09/08/2020   HGB 14.2 09/08/2020   HCT 43.5 09/08/2020   MCV 87.9 09/08/2020   PLT 175.0 09/08/2020   Lab Results  Component Value Date   NA 139 11/10/2020   K 4.1 11/10/2020   CO2 25 11/10/2020   GLUCOSE 106 (H) 11/10/2020   BUN 19 11/10/2020   CREATININE 0.94 11/10/2020  BILITOT 0.5  11/10/2020   ALKPHOS 38 (L) 11/10/2020   AST 14 11/10/2020   ALT 15 11/10/2020   PROT 6.9 11/10/2020   ALBUMIN 4.1 11/10/2020   CALCIUM 9.5 11/10/2020   GFR 87.12 11/10/2020   Lab Results  Component Value Date   CHOL 185 11/10/2020   Lab Results  Component Value Date   HDL 74.90 11/10/2020   Lab Results  Component Value Date   LDLCALC 100 (H) 11/10/2020   Lab Results  Component Value Date   TRIG 55.0 11/10/2020   Lab Results  Component Value Date   CHOLHDL 2 11/10/2020   Lab Results  Component Value Date   HGBA1C 6.5 07/24/2019      Assessment & Plan:   Problem List Items Addressed This Visit       Cardiovascular and Mediastinum   Essential hypertension   Relevant Medications   spironolactone (ALDACTONE) 50 MG tablet   Other Relevant Orders   CBC     Other   Mixed hyperlipidemia   Relevant Medications   spironolactone (ALDACTONE) 50 MG tablet   Other Relevant Orders   Comprehensive metabolic panel   Lipid panel   Healthcare maintenance   Elevated LDL cholesterol level   Relevant Orders   Comprehensive metabolic panel   Lipid panel   Elevated PSA, less than 10 ng/ml   Relevant Orders   PSA   Class 2 obesity due to excess calories with body mass index (BMI) of 37.0 to 37.9 in adult   Relevant Medications   Semaglutide, 1 MG/DOSE, 4 MG/3ML SOPN   Other Visit Diagnoses     Flu vaccine need    -  Primary   Relevant Orders   Flu Vaccine QUAD 6+ mos PF IM (Fluarix Quad PF) (Completed)   Medication side effect       Elevated glucose       Relevant Orders   Comprehensive metabolic panel   Hemoglobin A1c       Meds ordered this encounter  Medications   spironolactone (ALDACTONE) 50 MG tablet    Sig: Take 1 tablet (50 mg total) by mouth daily.    Dispense:  90 tablet    Refill:  1   Semaglutide, 1 MG/DOSE, 4 MG/3ML SOPN    Sig: Inject 1 mg as directed once a week.    Dispense:  3 mL    Refill:  5    Please administer needles for pen.     Follow-up: Return in about 3 months (around 05/12/2021).   Congratulated him on his 14 pound weight loss and body to him to keep it up.  Agrees to try Ozempic.  His wife will help him with the injections.  Hopefully LDL cholesterol will be lower with his weight loss.  We will change to spironolactone from amlodipine.  Information was given on semaglutide.  Libby Maw, MD

## 2021-03-05 ENCOUNTER — Other Ambulatory Visit: Payer: Self-pay | Admitting: Family Medicine

## 2021-03-05 DIAGNOSIS — J301 Allergic rhinitis due to pollen: Secondary | ICD-10-CM

## 2021-03-06 ENCOUNTER — Telehealth: Payer: Self-pay | Admitting: Family Medicine

## 2021-03-06 DIAGNOSIS — N401 Enlarged prostate with lower urinary tract symptoms: Secondary | ICD-10-CM

## 2021-03-06 DIAGNOSIS — Z6837 Body mass index (BMI) 37.0-37.9, adult: Secondary | ICD-10-CM

## 2021-03-06 DIAGNOSIS — E6609 Other obesity due to excess calories: Secondary | ICD-10-CM

## 2021-03-06 MED ORDER — TAMSULOSIN HCL 0.4 MG PO CAPS
0.4000 mg | ORAL_CAPSULE | Freq: Every day | ORAL | 1 refills | Status: DC
Start: 2021-03-06 — End: 2021-10-25

## 2021-03-06 MED ORDER — SEMAGLUTIDE (1 MG/DOSE) 4 MG/3ML ~~LOC~~ SOPN: 1.0000 mg | PEN_INJECTOR | SUBCUTANEOUS | 5 refills | Status: DC

## 2021-03-22 ENCOUNTER — Other Ambulatory Visit: Payer: Self-pay | Admitting: Family Medicine

## 2021-03-22 DIAGNOSIS — E7800 Pure hypercholesterolemia, unspecified: Secondary | ICD-10-CM

## 2021-03-22 DIAGNOSIS — I2510 Atherosclerotic heart disease of native coronary artery without angina pectoris: Secondary | ICD-10-CM

## 2021-04-06 ENCOUNTER — Encounter: Payer: Self-pay | Admitting: Family Medicine

## 2021-04-06 DIAGNOSIS — R739 Hyperglycemia, unspecified: Secondary | ICD-10-CM

## 2021-04-06 DIAGNOSIS — E6609 Other obesity due to excess calories: Secondary | ICD-10-CM

## 2021-04-08 ENCOUNTER — Other Ambulatory Visit: Payer: Self-pay | Admitting: Family Medicine

## 2021-04-08 DIAGNOSIS — E782 Mixed hyperlipidemia: Secondary | ICD-10-CM

## 2021-04-10 MED ORDER — DULAGLUTIDE 0.75 MG/0.5ML ~~LOC~~ SOAJ: 0.7500 mg | SUBCUTANEOUS | 5 refills | Status: DC

## 2021-04-11 NOTE — Telephone Encounter (Signed)
Spoke to patient. All issues resolved. Sw, cma

## 2021-05-12 ENCOUNTER — Ambulatory Visit: Payer: BC Managed Care – PPO | Admitting: Family Medicine

## 2021-05-18 ENCOUNTER — Encounter: Payer: Self-pay | Admitting: Family Medicine

## 2021-05-18 ENCOUNTER — Other Ambulatory Visit: Payer: Self-pay

## 2021-05-18 ENCOUNTER — Ambulatory Visit: Payer: BC Managed Care – PPO | Admitting: Family Medicine

## 2021-05-18 VITALS — BP 122/72 | HR 92 | Temp 97.1°F | Ht 73.0 in | Wt 270.0 lb

## 2021-05-18 DIAGNOSIS — B349 Viral infection, unspecified: Secondary | ICD-10-CM | POA: Diagnosis not present

## 2021-05-18 DIAGNOSIS — Z6837 Body mass index (BMI) 37.0-37.9, adult: Secondary | ICD-10-CM

## 2021-05-18 DIAGNOSIS — R7303 Prediabetes: Secondary | ICD-10-CM

## 2021-05-18 DIAGNOSIS — I10 Essential (primary) hypertension: Secondary | ICD-10-CM | POA: Diagnosis not present

## 2021-05-18 DIAGNOSIS — E6609 Other obesity due to excess calories: Secondary | ICD-10-CM

## 2021-05-18 MED ORDER — SEMAGLUTIDE (2 MG/DOSE) 8 MG/3ML ~~LOC~~ SOPN: 2.0000 mg | PEN_INJECTOR | SUBCUTANEOUS | 3 refills | Status: DC

## 2021-05-18 MED ORDER — OZEMPIC (1 MG/DOSE) 4 MG/3ML ~~LOC~~ SOPN: 1.0000 mg | PEN_INJECTOR | SUBCUTANEOUS | 2 refills | Status: DC

## 2021-05-18 NOTE — Progress Notes (Addendum)
Established Patient Office Visit  Subjective:  Patient ID: Clifford Newman, male    DOB: 10/07/1960  Age: 60 y.o. MRN: [id number]  CC:  Chief Complaint  Patient presents with   Follow-up    3 month follow up, no concerns.     HPI KITT LEDET presents for follow-up of hypertension, obesity and prediabetes.  We have changed patient to dulaglutide after a Ozempic when on backorder.  He is now back on Ozempic 1 mg weekly.  He is tolerating the drug well.  Doing well status post changing from amlodipine to spironolactone.  Blood pressure well controlled.  Status post URI last week that seems to have resolved.  There is a stuffy nose and mild cough.  He denied fevers chills wheezing difficulty breathing myalgias or arthralgias.  Past Medical History:  Diagnosis Date   Allergy    seasonal   Arthritis    bilateral feet   Colon polyp 2001   Dr Fuller Plan   Hyperlipidemia     Past Surgical History:  Procedure Laterality Date   COLONOSCOPY  2006 & 2011   negative, Dr Fuller Plan   COLONOSCOPY W/ POLYPECTOMY  2001   CYSTECTOMY     Ganglion cyst-left wrist    HAND SURGERY  1983   Right thumb surgery    ROTATOR CUFF REPAIR Right    WISDOM TOOTH EXTRACTION      Family History  Problem Relation Age of Onset   Colon cancer Father 35   Diabetes Father        IDDM   Colon polyps Father    Stroke Paternal Grandfather        >55   Stroke Paternal Grandmother        > 84   Colon cancer Paternal Uncle    Colon cancer Paternal Uncle    Arthritis Mother    Heart disease Neg Hx    Esophageal cancer Neg Hx    Rectal cancer Neg Hx    Stomach cancer Neg Hx     Social History   Socioeconomic History   Marital status: Married    Spouse name: Not on file   Number of children: Not on file   Years of education: Not on file   Highest education level: Not on file  Occupational History   Not on file  Tobacco Use   Smoking status: Former    Packs/day: 1.50    Years: 37.00    Pack years:  55.50    Types: Cigarettes    Quit date: 12/20/2014    Years since quitting: 6.4   Smokeless tobacco: Former  Scientific laboratory technician Use: Never used  Substance and Sexual Activity   Alcohol use: Yes    Comment: 4 shots per week   Drug use: No   Sexual activity: Yes  Other Topics Concern   Not on file  Social History Narrative   Not on file   Social Determinants of Health   Financial Resource Strain: Not on file  Food Insecurity: Not on file  Transportation Needs: Not on file  Physical Activity: Not on file  Stress: Not on file  Social Connections: Not on file  Intimate Partner Violence: Not on file    Outpatient Medications Prior to Visit  Medication Sig Dispense Refill   atorvastatin (LIPITOR) 40 MG tablet TAKE 1 TABLET BY MOUTH EVERY DAY 90 tablet 3   [facility] ASPIRIN LOW STRENGTH 81 MG EC tablet TAKE 1 TABLET BY MOUTH  EVERY DAY 365 tablet 3   fluticasone (FLONASE) 50 MCG/ACT nasal spray SPRAY 2 SPRAYS INTO EACH NOSTRIL EVERY DAY 48 mL 2   spironolactone (ALDACTONE) 50 MG tablet Take 1 tablet (50 mg total) by mouth daily. 90 tablet 1   tamsulosin (FLOMAX) 0.4 MG CAPS capsule Take 1 capsule (0.4 mg total) by mouth daily. 90 capsule 1   Dulaglutide 0.75 MG/0.5ML SOPN Inject 0.75 mg into the skin once a week. 6 mL 5   OZEMPIC, 1 MG/DOSE, 4 MG/3ML SOPN Inject 1 mg into the skin once a week. (Patient not taking: Reported on 05/18/2021)     No facility-administered medications prior to visit.    No Known Allergies  ROS Review of Systems  Constitutional:  Negative for chills, diaphoresis, fatigue, fever and unexpected weight change.  HENT: Negative.    Eyes:  Negative for photophobia and visual disturbance.  Respiratory: Negative.    Cardiovascular: Negative.   Gastrointestinal: Negative.   Endocrine: Negative for polyphagia and polyuria.  Genitourinary: Negative.   Neurological:  Negative for speech difficulty and weakness.     Objective:    Physical Exam Vitals and  nursing note reviewed.  Constitutional:      General: He is not in acute distress.    Appearance: Normal appearance. He is not ill-appearing, toxic-appearing or diaphoretic.  HENT:     Head: Normocephalic and atraumatic.     Right Ear: Tympanic membrane, ear canal and external ear normal.     Left Ear: Tympanic membrane, ear canal and external ear normal.     Mouth/Throat:     Mouth: Mucous membranes are moist.     Pharynx: Oropharynx is clear. No oropharyngeal exudate or posterior oropharyngeal erythema.  Eyes:     General: No scleral icterus.       Right eye: No discharge.        Left eye: No discharge.     Extraocular Movements: Extraocular movements intact.     Conjunctiva/sclera: Conjunctivae normal.     Pupils: Pupils are equal, round, and reactive to light.  Neck:     Vascular: No carotid bruit.  Cardiovascular:     Rate and Rhythm: Normal rate and regular rhythm.  Pulmonary:     Effort: Pulmonary effort is normal. No respiratory distress.     Breath sounds: Normal breath sounds. No wheezing or rales.  Abdominal:     General: Bowel sounds are normal.  Musculoskeletal:     Cervical back: No rigidity or tenderness.  Lymphadenopathy:     Cervical: No cervical adenopathy.  Skin:    General: Skin is warm and dry.  Neurological:     Mental Status: He is alert and oriented to person, place, and time.  Psychiatric:        Mood and Affect: Mood normal.        Behavior: Behavior normal.    BP 122/72 (BP Location: Right Arm, Patient Position: Sitting, Cuff Size: Large)    Pulse 92    Temp (!) 97.1 F (36.2 C) (Temporal)    Ht 6\' 1"  (1.854 m)    Wt 270 lb (122.5 kg)    SpO2 96%    BMI 35.62 kg/m  Wt Readings from Last 3 Encounters:  05/18/21 270 lb (122.5 kg)  02/10/21 271 lb 9.6 oz (123.2 kg)  11/10/20 285 lb 6.4 oz (129.5 kg)     Health Maintenance Due  Topic Date Due   Pneumococcal Vaccine 50-78 Years old (1 - PCV) Never  done   Zoster Vaccines- Shingrix (1 of 2)  Never done    There are no preventive care reminders to display for this patient.  Lab Results  Component Value Date   TSH 1.85 08/12/2017   Lab Results  Component Value Date   WBC 3.2 (L) 02/10/2021   HGB 14.0 02/10/2021   HCT 43.2 02/10/2021   MCV 89.0 02/10/2021   PLT 173.0 02/10/2021   Lab Results  Component Value Date   NA 139 02/10/2021   K 4.1 02/10/2021   CO2 27 02/10/2021   GLUCOSE 96 02/10/2021   BUN 20 02/10/2021   CREATININE 0.80 02/10/2021   BILITOT 0.5 02/10/2021   ALKPHOS 40 02/10/2021   AST 21 02/10/2021   ALT 24 02/10/2021   PROT 7.0 02/10/2021   ALBUMIN 4.2 02/10/2021   CALCIUM 9.4 02/10/2021   GFR 94.94 02/10/2021   Lab Results  Component Value Date   CHOL 174 02/10/2021   Lab Results  Component Value Date   HDL 67.80 02/10/2021   Lab Results  Component Value Date   LDLCALC 94 02/10/2021   Lab Results  Component Value Date   TRIG 61.0 02/10/2021   Lab Results  Component Value Date   CHOLHDL 3 02/10/2021   Lab Results  Component Value Date   HGBA1C 6.6 (H) 02/10/2021      Assessment & Plan:   Problem List Items Addressed This Visit       Cardiovascular and Mediastinum   Essential hypertension     Other   Class 2 obesity due to excess calories with body mass index (BMI) of 37.0 to 37.9 in adult - Primary   Relevant Medications   Semaglutide, 2 MG/DOSE, 8 MG/3ML SOPN   Other Visit Diagnoses     Prediabetes       Relevant Medications   Semaglutide, 2 MG/DOSE, 8 MG/3ML SOPN   Other Relevant Orders   Hemoglobin A1c   Viral syndrome           Meds ordered this encounter  Medications   DISCONTD: OZEMPIC, 1 MG/DOSE, 4 MG/3ML SOPN    Sig: Inject 1 mg into the skin once a week.    Dispense:  12 mL    Refill:  2   Semaglutide, 2 MG/DOSE, 8 MG/3ML SOPN    Sig: Inject 2 mg as directed once a week.    Dispense:  9 mL    Refill:  3    Follow-up: Return in about 6 months (around 11/16/2021), or if symptoms worsen or  fail to improve.  Will continue Ozempic and Aldactone.  Follow-up in 6 months for recheck.  Got Mr. Vanroekel ready to go today he is going to go on  Libby Maw, MD

## 2021-05-18 NOTE — Addendum Note (Signed)
Addended by: Jon Billings on: 05/18/2021 05:05 PM   Modules accepted: Orders

## 2021-05-19 LAB — HEMOGLOBIN A1C: Hgb A1c MFr Bld: 6.1 % (ref 4.6–6.5)

## 2021-06-13 ENCOUNTER — Other Ambulatory Visit: Payer: Self-pay | Admitting: Family Medicine

## 2021-07-17 ENCOUNTER — Other Ambulatory Visit: Payer: Self-pay | Admitting: Family Medicine

## 2021-07-17 DIAGNOSIS — I10 Essential (primary) hypertension: Secondary | ICD-10-CM

## 2021-09-07 ENCOUNTER — Other Ambulatory Visit: Payer: Self-pay | Admitting: Family Medicine

## 2021-09-07 DIAGNOSIS — I10 Essential (primary) hypertension: Secondary | ICD-10-CM

## 2021-09-08 ENCOUNTER — Other Ambulatory Visit: Payer: Self-pay

## 2021-09-08 DIAGNOSIS — I2510 Atherosclerotic heart disease of native coronary artery without angina pectoris: Secondary | ICD-10-CM

## 2021-09-08 DIAGNOSIS — E782 Mixed hyperlipidemia: Secondary | ICD-10-CM

## 2021-09-08 MED ORDER — ATORVASTATIN CALCIUM 40 MG PO TABS: 40.0000 mg | ORAL_TABLET | Freq: Every day | ORAL | 1 refills | Status: DC

## 2021-10-25 ENCOUNTER — Other Ambulatory Visit: Payer: Self-pay | Admitting: Family Medicine

## 2021-10-25 DIAGNOSIS — N401 Enlarged prostate with lower urinary tract symptoms: Secondary | ICD-10-CM

## 2021-11-16 ENCOUNTER — Encounter: Payer: Self-pay | Admitting: Family Medicine

## 2021-11-16 ENCOUNTER — Ambulatory Visit: Payer: BC Managed Care – PPO | Admitting: Family Medicine

## 2021-11-16 VITALS — BP 120/76 | HR 72 | Temp 97.4°F | Ht 73.0 in | Wt 271.6 lb

## 2021-11-16 DIAGNOSIS — N5201 Erectile dysfunction due to arterial insufficiency: Secondary | ICD-10-CM

## 2021-11-16 DIAGNOSIS — I10 Essential (primary) hypertension: Secondary | ICD-10-CM | POA: Diagnosis not present

## 2021-11-16 DIAGNOSIS — B349 Viral infection, unspecified: Secondary | ICD-10-CM | POA: Diagnosis not present

## 2021-11-16 DIAGNOSIS — R7303 Prediabetes: Secondary | ICD-10-CM | POA: Diagnosis not present

## 2021-11-16 DIAGNOSIS — N401 Enlarged prostate with lower urinary tract symptoms: Secondary | ICD-10-CM | POA: Insufficient documentation

## 2021-11-16 DIAGNOSIS — R5383 Other fatigue: Secondary | ICD-10-CM | POA: Diagnosis not present

## 2021-11-16 DIAGNOSIS — R351 Nocturia: Secondary | ICD-10-CM

## 2021-11-16 LAB — BASIC METABOLIC PANEL
BUN: 14 mg/dL (ref 6–23)
CO2: 24 mEq/L (ref 19–32)
Calcium: 9.6 mg/dL (ref 8.4–10.5)
Chloride: 104 mEq/L (ref 96–112)
Creatinine, Ser: 0.95 mg/dL (ref 0.40–1.50)
GFR: 85.4 mL/min (ref >60.00)
Glucose, Bld: 97 mg/dL (ref 70–99)
Potassium: 3.7 mEq/L (ref 3.5–5.1)
Sodium: 135 mEq/L (ref 135–145)

## 2021-11-16 LAB — HEMOGLOBIN A1C: Hgb A1c MFr Bld: 6 % (ref 4.6–6.5)

## 2021-11-16 LAB — TSH: TSH: 1.56 u[IU]/mL (ref 0.35–5.50)

## 2021-11-16 MED ORDER — TADALAFIL 20 MG PO TABS: 10.0000 mg | ORAL_TABLET | ORAL | 11 refills | Status: DC | PRN

## 2021-11-16 NOTE — Progress Notes (Signed)
Established Patient Office Visit  Subjective   Patient ID: Clifford Newman, male    DOB: 08/18/1960  Age: 61 y.o. MRN: [id number]  Chief Complaint  Patient presents with   Follow-up    6 month f/u.  Fasting today.  C/o having sinus congestion, ST x 2 days.  Has tried Thera flu and tea w/honey.      HPI follow-up of hypertension, prediabetes, ED, BPH and URI symptoms.  Fatigue and malaise for the last 2 days associated with stuffy nose drainage sore throat cough.  Denies fevers chills headache difficulty breathing or wheezing.  Doing well with semaglutide 2 mg weekly.  Blood pressure well controlled with spironolactone.  Would like to try tadalafil for ED.  Tadalafil is helping urine flow.  He is still experiencing some nocturia despite holding fluids few hours before bedtime.    Review of Systems  Constitutional:  Positive for malaise/fatigue. Negative for chills, fever and weight loss.  HENT:  Positive for congestion and sore throat. Negative for sinus pain.   Eyes:  Negative for blurred vision, discharge and redness.  Respiratory:  Positive for cough. Negative for sputum production, shortness of breath and wheezing.   Cardiovascular: Negative.   Gastrointestinal:  Negative for abdominal pain, nausea and vomiting.  Genitourinary: Negative.  Negative for frequency, hematuria and urgency.  Musculoskeletal: Negative.  Negative for myalgias.  Skin:  Negative for rash.  Neurological:  Negative for tingling, loss of consciousness, weakness and headaches.  Endo/Heme/Allergies:  Negative for polydipsia.      Objective:     BP 120/76   Pulse 72   Temp (!) 97.4 F (36.3 C) (Temporal)   Ht '6\' 1"'$  (1.854 m)   Wt 271 lb 9.6 oz (123.2 kg)   SpO2 94%   BMI 35.83 kg/m  BP Readings from Last 3 Encounters:  11/16/21 120/76  05/18/21 122/72  02/10/21 138/78   Wt Readings from Last 3 Encounters:  11/16/21 271 lb 9.6 oz (123.2 kg)  05/18/21 270 lb (122.5 kg)  02/10/21 271 lb 9.6 oz  (123.2 kg)      Physical Exam Constitutional:      General: He is not in acute distress.    Appearance: Normal appearance. He is not ill-appearing, toxic-appearing or diaphoretic.  HENT:     Head: Normocephalic and atraumatic.     Right Ear: External ear normal.     Left Ear: External ear normal.  Eyes:     General: No scleral icterus.       Right eye: No discharge.        Left eye: No discharge.     Extraocular Movements: Extraocular movements intact.     Conjunctiva/sclera: Conjunctivae normal.     Pupils: Pupils are equal, round, and reactive to light.  Cardiovascular:     Rate and Rhythm: Normal rate and regular rhythm.  Pulmonary:     Effort: Pulmonary effort is normal. No respiratory distress.     Breath sounds: Normal breath sounds.  Abdominal:     General: Bowel sounds are normal.  Musculoskeletal:     Cervical back: No rigidity or tenderness.  Skin:    General: Skin is warm and dry.  Neurological:     Mental Status: He is alert and oriented to person, place, and time.  Psychiatric:        Mood and Affect: Mood normal.        Behavior: Behavior normal.      No results found for  any visits on 11/16/21.    The 10-year ASCVD risk score (Arnett DK, et al., 2019) is: 11.8%    Assessment & Plan:   Problem List Items Addressed This Visit       Cardiovascular and Mediastinum   Erectile dysfunction due to arterial insufficiency   Relevant Medications   tadalafil (CIALIS) 20 MG tablet   Essential hypertension - Primary   Relevant Medications   tadalafil (CIALIS) 20 MG tablet   Other Relevant Orders   Basic metabolic panel     Other   Other fatigue   Relevant Orders   TSH   Viral syndrome   Relevant Orders   POC COVID-19   Prediabetes   Relevant Orders   Basic metabolic panel   Hemoglobin A1c    Return in about 6 months (around 05/18/2022), or call one week if not improving.Libby Maw, MD

## 2021-11-17 LAB — NOVEL CORONAVIRUS, NAA: SARS-CoV-2, NAA: NOT DETECTED

## 2021-11-24 ENCOUNTER — Encounter: Payer: Self-pay | Admitting: Family Medicine

## 2021-11-24 ENCOUNTER — Ambulatory Visit: Payer: BC Managed Care – PPO | Admitting: Family Medicine

## 2021-11-24 ENCOUNTER — Ambulatory Visit (INDEPENDENT_AMBULATORY_CARE_PROVIDER_SITE_OTHER): Payer: BC Managed Care – PPO

## 2021-11-24 ENCOUNTER — Ambulatory Visit (HOSPITAL_COMMUNITY): Payer: BC Managed Care – PPO

## 2021-11-24 VITALS — BP 130/86 | HR 77 | Temp 97.1°F | Ht 73.0 in | Wt 268.4 lb

## 2021-11-24 DIAGNOSIS — N5201 Erectile dysfunction due to arterial insufficiency: Secondary | ICD-10-CM

## 2021-11-24 DIAGNOSIS — N401 Enlarged prostate with lower urinary tract symptoms: Secondary | ICD-10-CM

## 2021-11-24 DIAGNOSIS — J22 Unspecified acute lower respiratory infection: Secondary | ICD-10-CM

## 2021-11-24 DIAGNOSIS — R351 Nocturia: Secondary | ICD-10-CM

## 2021-11-24 MED ORDER — TAMSULOSIN HCL 0.4 MG PO CAPS: 0.8000 mg | ORAL_CAPSULE | Freq: Every day | ORAL | 3 refills | Status: DC

## 2021-11-24 MED ORDER — CLARITHROMYCIN ER 500 MG PO TB24: 1000.0000 mg | ORAL_TABLET | Freq: Every day | ORAL | 0 refills | Status: AC

## 2021-11-24 MED ORDER — TADALAFIL 20 MG PO TABS: 10.0000 mg | ORAL_TABLET | ORAL | 11 refills | Status: DC | PRN

## 2021-11-24 NOTE — Progress Notes (Unsigned)
Persistent 10 hx of URI symptoms. Productive cough and rhinorrhea. Denies SOB

## 2021-11-24 NOTE — Progress Notes (Signed)
Established Patient Office Visit  Subjective   Patient ID: Clifford Newman, male    DOB: 1961-03-15  Age: 61 y.o. MRN: [id number]  Chief Complaint  Patient presents with   Acute Visit    C/o chest / nasal congestion x 6 days No other concerns     HPI follow-up of persisting 10-day history of URI symptoms with cough now productive purulent phlegm.  There is nasal congestion with purulent rhinorrhea.  Denies fevers chills or difficulty breathing.  No shortness of breath.  There is no facial pressure or pain.  Would like to go ahead and try the higher dose of tamsulosin.  Would like to continue tadalafil    Review of Systems  Constitutional: Negative.   HENT: Negative.    Eyes:  Negative for blurred vision, discharge and redness.  Respiratory:  Positive for cough and sputum production. Negative for hemoptysis, shortness of breath and wheezing.   Cardiovascular: Negative.   Gastrointestinal:  Negative for abdominal pain.  Genitourinary: Negative.   Musculoskeletal: Negative.  Negative for myalgias.  Skin:  Negative for rash.  Neurological:  Negative for tingling, loss of consciousness and weakness.  Endo/Heme/Allergies:  Negative for polydipsia.      Objective:     BP 130/86 (BP Location: Right Arm, Patient Position: Sitting, Cuff Size: Normal)   Pulse 77   Temp (!) 97.1 F (36.2 C) (Temporal)   Ht '6\' 1"'$  (1.854 m)   Wt 268 lb 6.4 oz (121.7 kg)   SpO2 96%   BMI 35.41 kg/m  Wt Readings from Last 3 Encounters:  11/24/21 268 lb 6.4 oz (121.7 kg)  11/16/21 271 lb 9.6 oz (123.2 kg)  05/18/21 270 lb (122.5 kg)      Physical Exam Constitutional:      General: He is not in acute distress.    Appearance: Normal appearance. He is not ill-appearing, toxic-appearing or diaphoretic.  HENT:     Head: Normocephalic and atraumatic.     Right Ear: External ear normal.     Left Ear: External ear normal.     Mouth/Throat:     Mouth: Mucous membranes are moist.     Pharynx:  Oropharynx is clear. No oropharyngeal exudate or posterior oropharyngeal erythema.  Eyes:     General: No scleral icterus.       Right eye: No discharge.        Left eye: No discharge.     Extraocular Movements: Extraocular movements intact.     Conjunctiva/sclera: Conjunctivae normal.     Pupils: Pupils are equal, round, and reactive to light.  Cardiovascular:     Rate and Rhythm: Normal rate and regular rhythm.  Pulmonary:     Effort: Pulmonary effort is normal. No respiratory distress.     Breath sounds: Normal breath sounds. Decreased air movement present. No wheezing, rhonchi or rales.  Abdominal:     General: Bowel sounds are normal.     Tenderness: There is no abdominal tenderness. There is no guarding.  Musculoskeletal:     Cervical back: No rigidity or tenderness.  Skin:    General: Skin is warm and dry.  Neurological:     Mental Status: He is alert and oriented to person, place, and time.  Psychiatric:        Mood and Affect: Mood normal.        Behavior: Behavior normal.      No results found for any visits on 11/24/21.    The 10-year ASCVD  risk score (Arnett DK, et al., 2019) is: 13.6%    Assessment & Plan:   Problem List Items Addressed This Visit       Cardiovascular and Mediastinum   Erectile dysfunction due to arterial insufficiency   Relevant Medications   tadalafil (CIALIS) 20 MG tablet     Respiratory   Lower respiratory infection - Primary   Relevant Medications   clarithromycin (BIAXIN XL) 500 MG 24 hr tablet   Other Relevant Orders   DG Chest 2 View     Other   Benign prostatic hyperplasia with nocturia   Relevant Medications   tamsulosin (FLOMAX) 0.4 MG CAPS capsule    Return if symptoms worsen or fail to improve.  Treatment 10-day course of Biaxin.  Increased Flomax 2.8 mg daily.  Continue 10-20 tadalafil every other day as needed for ED.  Libby Maw, MD

## 2021-11-25 ENCOUNTER — Encounter: Payer: Self-pay | Admitting: Family Medicine

## 2021-11-27 ENCOUNTER — Telehealth: Payer: Self-pay

## 2021-11-27 DIAGNOSIS — N5201 Erectile dysfunction due to arterial insufficiency: Secondary | ICD-10-CM

## 2021-11-27 MED ORDER — TADALAFIL 20 MG PO TABS: 10.0000 mg | ORAL_TABLET | ORAL | 11 refills | Status: DC | PRN

## 2021-12-12 NOTE — Telephone Encounter (Signed)
Close chart

## 2021-12-19 ENCOUNTER — Ambulatory Visit (HOSPITAL_COMMUNITY)
Admission: RE | Admit: 2021-12-19 | Discharge: 2021-12-19 | Disposition: A | Discharge status: Home / Self Care | Payer: BC Managed Care – PPO | Source: Ambulatory Visit | Attending: Acute Care | Admitting: Acute Care

## 2021-12-19 DIAGNOSIS — J439 Emphysema, unspecified: Secondary | ICD-10-CM | POA: Diagnosis not present

## 2021-12-19 DIAGNOSIS — I2510 Atherosclerotic heart disease of native coronary artery without angina pectoris: Secondary | ICD-10-CM | POA: Diagnosis not present

## 2021-12-19 DIAGNOSIS — I700 Atherosclerosis of aorta: Secondary | ICD-10-CM | POA: Insufficient documentation

## 2021-12-19 DIAGNOSIS — Z122 Encounter for screening for malignant neoplasm of respiratory organs: Secondary | ICD-10-CM | POA: Insufficient documentation

## 2021-12-19 DIAGNOSIS — F17210 Nicotine dependence, cigarettes, uncomplicated: Secondary | ICD-10-CM | POA: Diagnosis present

## 2022-01-12 NOTE — Telephone Encounter (Signed)
Na

## 2022-01-21 ENCOUNTER — Other Ambulatory Visit: Payer: Self-pay | Admitting: Family Medicine

## 2022-01-21 DIAGNOSIS — I10 Essential (primary) hypertension: Secondary | ICD-10-CM

## 2022-02-12 ENCOUNTER — Other Ambulatory Visit: Payer: Self-pay

## 2022-02-12 DIAGNOSIS — Z122 Encounter for screening for malignant neoplasm of respiratory organs: Secondary | ICD-10-CM

## 2022-02-12 DIAGNOSIS — F17210 Nicotine dependence, cigarettes, uncomplicated: Secondary | ICD-10-CM

## 2022-02-12 DIAGNOSIS — Z87891 Personal history of nicotine dependence: Secondary | ICD-10-CM

## 2022-02-19 ENCOUNTER — Other Ambulatory Visit: Payer: Self-pay | Admitting: Family Medicine

## 2022-02-19 DIAGNOSIS — I2510 Atherosclerotic heart disease of native coronary artery without angina pectoris: Secondary | ICD-10-CM

## 2022-02-19 DIAGNOSIS — E782 Mixed hyperlipidemia: Secondary | ICD-10-CM

## 2022-02-26 ENCOUNTER — Other Ambulatory Visit: Payer: Self-pay | Admitting: Family Medicine

## 2022-04-06 ENCOUNTER — Telehealth: Payer: Self-pay | Admitting: Family Medicine

## 2022-04-06 ENCOUNTER — Other Ambulatory Visit: Payer: Self-pay | Admitting: Family Medicine

## 2022-04-06 DIAGNOSIS — R7303 Prediabetes: Secondary | ICD-10-CM

## 2022-04-06 MED ORDER — TIRZEPATIDE 7.5 MG/0.5ML ~~LOC~~ SOAJ: 7.5000 mg | SUBCUTANEOUS | 1 refills | Status: DC

## 2022-04-06 NOTE — Telephone Encounter (Signed)
Patient aware new Rx sent in

## 2022-04-06 NOTE — Telephone Encounter (Signed)
Pt is having difficultly getting a script for Ozempic, he is wanting to know if there is an alternate medication he can take. Please advise pt at  [phone].

## 2022-04-06 NOTE — Telephone Encounter (Signed)
Please advise message below per patient insurance not covering Ozempic

## 2022-05-18 ENCOUNTER — Ambulatory Visit: Payer: BC Managed Care – PPO | Admitting: Family Medicine

## 2022-05-22 ENCOUNTER — Ambulatory Visit: Payer: BC Managed Care – PPO | Admitting: Family Medicine

## 2022-05-22 ENCOUNTER — Encounter: Payer: Self-pay | Admitting: Family Medicine

## 2022-05-22 VITALS — BP 142/78 | HR 80 | Temp 97.0°F | Ht 73.0 in | Wt 265.4 lb

## 2022-05-22 DIAGNOSIS — N401 Enlarged prostate with lower urinary tract symptoms: Secondary | ICD-10-CM | POA: Diagnosis not present

## 2022-05-22 DIAGNOSIS — T887XXA Unspecified adverse effect of drug or medicament, initial encounter: Secondary | ICD-10-CM

## 2022-05-22 DIAGNOSIS — R7303 Prediabetes: Secondary | ICD-10-CM | POA: Diagnosis not present

## 2022-05-22 DIAGNOSIS — Z23 Encounter for immunization: Secondary | ICD-10-CM | POA: Diagnosis not present

## 2022-05-22 DIAGNOSIS — E7800 Pure hypercholesterolemia, unspecified: Secondary | ICD-10-CM | POA: Diagnosis not present

## 2022-05-22 DIAGNOSIS — R351 Nocturia: Secondary | ICD-10-CM | POA: Diagnosis not present

## 2022-05-22 DIAGNOSIS — I10 Essential (primary) hypertension: Secondary | ICD-10-CM

## 2022-05-22 DIAGNOSIS — Z6837 Body mass index (BMI) 37.0-37.9, adult: Secondary | ICD-10-CM

## 2022-05-22 DIAGNOSIS — N644 Mastodynia: Secondary | ICD-10-CM

## 2022-05-22 LAB — URINALYSIS, ROUTINE W REFLEX MICROSCOPIC
Bilirubin Urine: NEGATIVE
Hgb urine dipstick: NEGATIVE
Ketones, ur: NEGATIVE
Leukocytes,Ua: NEGATIVE
Nitrite: NEGATIVE
RBC / HPF: NONE SEEN (ref 0–?)
Specific Gravity, Urine: 1.02 (ref 1.000–1.030)
Total Protein, Urine: NEGATIVE
Urine Glucose: NEGATIVE
Urobilinogen, UA: 0.2 (ref 0.0–1.0)
pH: 7.5 (ref 5.0–8.0)

## 2022-05-22 LAB — COMPREHENSIVE METABOLIC PANEL
ALT: 20 U/L (ref 0–53)
AST: 19 U/L (ref 0–37)
Albumin: 4.3 g/dL (ref 3.5–5.2)
Alkaline Phosphatase: 39 U/L (ref 39–117)
BUN: 23 mg/dL (ref 6–23)
CO2: 27 mEq/L (ref 19–32)
Calcium: 9.7 mg/dL (ref 8.4–10.5)
Chloride: 103 mEq/L (ref 96–112)
Creatinine, Ser: 0.83 mg/dL (ref 0.40–1.50)
GFR: 93.05 mL/min (ref >60.00)
Glucose, Bld: 103 mg/dL — ABNORMAL HIGH (ref 70–99)
Potassium: 4 mEq/L (ref 3.5–5.1)
Sodium: 140 mEq/L (ref 135–145)
Total Bilirubin: 0.6 mg/dL (ref 0.2–1.2)
Total Protein: 7.1 g/dL (ref 6.0–8.3)

## 2022-05-22 LAB — LIPID PANEL
Cholesterol: 187 mg/dL (ref 0–200)
HDL: 80.3 mg/dL (ref >39.00)
LDL Cholesterol: 93 mg/dL (ref 0–99)
NonHDL: 106.61
Total CHOL/HDL Ratio: 2
Triglycerides: 67 mg/dL (ref 0.0–149.0)
VLDL: 13.4 mg/dL (ref 0.0–40.0)

## 2022-05-22 LAB — CBC
HCT: 43.3 % (ref 39.0–52.0)
Hemoglobin: 14.3 g/dL (ref 13.0–17.0)
MCHC: 33 g/dL (ref 30.0–36.0)
MCV: 90.6 fl (ref 78.0–100.0)
Platelets: 189 10*3/uL (ref 150.0–400.0)
RBC: 4.78 Mil/uL (ref 4.22–5.81)
RDW: 14.4 % (ref 11.5–15.5)
WBC: 3.5 10*3/uL — ABNORMAL LOW (ref 4.0–10.5)

## 2022-05-22 LAB — HEMOGLOBIN A1C: Hgb A1c MFr Bld: 6.3 % (ref 4.6–6.5)

## 2022-05-22 LAB — MICROALBUMIN / CREATININE URINE RATIO
Creatinine,U: 127.3 mg/dL
Microalb Creat Ratio: 0.6 mg/g (ref 0.0–30.0)
Microalb, Ur: 0.7 mg/dL (ref 0.0–1.9)

## 2022-05-22 LAB — PSA: PSA: 2.13 ng/mL (ref 0.10–4.00)

## 2022-05-22 MED ORDER — HYDROCHLOROTHIAZIDE 25 MG PO TABS: 25.0000 mg | ORAL_TABLET | Freq: Every day | ORAL | 3 refills | Status: DC

## 2022-05-22 NOTE — Progress Notes (Signed)
Established Patient Office Visit   Subjective:  Patient ID: Clifford Newman, male    DOB: [birth year]  Age: 62 y.o. MRN: [id number]  Chief Complaint  Patient presents with   Office Visit    6 month f/u Pt fasting      HPI Encounter Diagnoses  Name Primary?   Prediabetes Yes   Elevated LDL cholesterol level    Essential hypertension    Class 2 severe obesity due to excess calories with serious comorbidity and body mass index (BMI) of 37.0 to 37.9 in adult [facility])    Benign prostatic hyperplasia with nocturia    Medication side effect    Breast tenderness in male    Need for shingles vaccine    For follow-up of above.  Continues liraglutide for obesity and prediabetes.  Continues atorvastatin 40 mg for elevated cholesterol.  Urine flow is improved with tamsulosin.  Has developed breast tenderness on the left.  He has been going to the gym regularly.  He continues with regular dental care twice yearly.   Review of Systems  Constitutional: Negative.   HENT: Negative.    Eyes:  Negative for blurred vision, discharge and redness.  Respiratory: Negative.    Cardiovascular: Negative.   Gastrointestinal:  Negative for abdominal pain.  Genitourinary: Negative.   Musculoskeletal: Negative.  Negative for myalgias.  Skin:  Negative for rash.  Neurological:  Negative for tingling, loss of consciousness and weakness.  Endo/Heme/Allergies:  Negative for polydipsia.     Current Outpatient Medications:    atorvastatin (LIPITOR) 40 MG tablet, TAKE 1 TABLET BY MOUTH EVERY DAY, Disp: 90 tablet, Rfl: 1   BD PEN NEEDLE NANO 2ND GEN 32G X 4 MM MISC, USE AS DIRECTED, Disp: 100 each, Rfl: 1   Biotin 10 MG TABS, Take by mouth., Disp: , Rfl:    [facility] ASPIRIN LOW STRENGTH 81 MG EC tablet, TAKE 1 TABLET BY MOUTH EVERY DAY, Disp: 365 tablet, Rfl: 3   Fexofenadine HCl (MUCINEX ALLERGY PO), Take 1 tablet by mouth 4 (four) times daily as needed., Disp: , Rfl:    fluticasone (FLONASE) 50 MCG/ACT nasal  spray, SPRAY 2 SPRAYS INTO EACH NOSTRIL EVERY DAY, Disp: 48 mL, Rfl: 2   hydrochlorothiazide (HYDRODIURIL) 25 MG tablet, Take 1 tablet (25 mg total) by mouth daily., Disp: 90 tablet, Rfl: 3   omeprazole (PRILOSEC) 20 MG capsule, Take 20 mg by mouth daily., Disp: , Rfl:    liraglutide (VICTOZA) 18 MG/3ML SOPN, Inject 1.8 mg into the skin daily. (Patient not taking: Reported on 05/22/2022), Disp: 9 mL, Rfl: 2   tadalafil (CIALIS) 20 MG tablet, Take 0.5-1 tablets (10-20 mg total) by mouth every other day as needed for erectile dysfunction., Disp: 10 tablet, Rfl: 11   tamsulosin (FLOMAX) 0.4 MG CAPS capsule, Take 2 capsules (0.8 mg total) by mouth daily., Disp: 180 capsule, Rfl: 3   Objective:     BP (!) 142/78 (BP Location: Right Arm, Patient Position: Sitting, Cuff Size: Normal)   Pulse 80   Temp (!) 97 F (36.1 C) (Temporal)   Ht '6\' 1"'$  (1.854 m)   Wt 265 lb 6.4 oz (120.4 kg)   SpO2 98%   BMI 35.02 kg/m  BP Readings from Last 3 Encounters:  05/22/22 (!) 142/78  11/24/21 130/86  11/16/21 120/76   Wt Readings from Last 3 Encounters:  05/22/22 265 lb 6.4 oz (120.4 kg)  11/24/21 268 lb 6.4 oz (121.7 kg)  11/16/21 271 lb 9.6 oz (123.2  kg)      Physical Exam Constitutional:      General: He is not in acute distress.    Appearance: Normal appearance. He is not ill-appearing, toxic-appearing or diaphoretic.  HENT:     Head: Normocephalic and atraumatic.     Right Ear: External ear normal.     Left Ear: External ear normal.     Mouth/Throat:     Mouth: Mucous membranes are moist.     Pharynx: Oropharynx is clear. No oropharyngeal exudate or posterior oropharyngeal erythema.  Eyes:     General: No scleral icterus.       Right eye: No discharge.        Left eye: No discharge.     Extraocular Movements: Extraocular movements intact.     Conjunctiva/sclera: Conjunctivae normal.     Pupils: Pupils are equal, round, and reactive to light.  Cardiovascular:     Rate and Rhythm: Normal  rate and regular rhythm.  Pulmonary:     Effort: Pulmonary effort is normal. No respiratory distress.     Breath sounds: Normal breath sounds.  Chest:  Breasts:    Right: Normal.     Left: Normal.     Comments: Gynecomastia noted.  Left breast appears to be slightly larger than right.  No masses were noted. Abdominal:     General: Bowel sounds are normal.  Musculoskeletal:     Cervical back: No rigidity or tenderness.  Skin:    General: Skin is warm and dry.  Neurological:     Mental Status: He is alert and oriented to person, place, and time.  Psychiatric:        Mood and Affect: Mood normal.        Behavior: Behavior normal.      No results found for any visits on 05/22/22.    The 10-year ASCVD risk score (Arnett DK, et al., 2019) is: 15.9%    Assessment & Plan:   Prediabetes -     Hemoglobin A1c -     Urinalysis, Routine w reflex microscopic -     Microalbumin / creatinine urine ratio -     Comprehensive metabolic panel  Elevated LDL cholesterol level -     Lipid panel -     Comprehensive metabolic panel  Essential hypertension -     CBC -     Urinalysis, Routine w reflex microscopic -     Comprehensive metabolic panel -     hydroCHLOROthiazide; Take 1 tablet (25 mg total) by mouth daily.  Dispense: 90 tablet; Refill: 3  Class 2 severe obesity due to excess calories with serious comorbidity and body mass index (BMI) of 37.0 to 37.9 in adult [facility])  Benign prostatic hyperplasia with nocturia -     PSA  Medication side effect  Breast tenderness in male -     US BREAST COMPLETE UNI LEFT INC AXILLA; Future  Need for shingles vaccine -     Varicella-zoster vaccine IM    Return in about 3 months (around 08/21/2022).   Have discontinued Aldactone.  Will start HCTZ.  Ultrasound of left breast.  Continue all other medications. Libby Maw, MD

## 2022-05-28 ENCOUNTER — Telehealth: Payer: Self-pay

## 2022-05-28 DIAGNOSIS — R7303 Prediabetes: Secondary | ICD-10-CM

## 2022-05-28 MED ORDER — TIRZEPATIDE 5 MG/0.5ML ~~LOC~~ SOAJ: 5.0000 mg | SUBCUTANEOUS | 1 refills | Status: DC

## 2022-05-28 NOTE — Telephone Encounter (Signed)
Spoke with patient who states the he is unable to take Victoza that was prescribed at the last visit per patient it causes bad diarrhea, stomach cramps, vomiting and very nauseous. Please advise.

## 2022-05-29 NOTE — Telephone Encounter (Signed)
Patient aware of message below.

## 2022-05-29 NOTE — Telephone Encounter (Signed)
Called patient to inform of new Rx and side effects no answer LMTCB

## 2022-06-06 ENCOUNTER — Other Ambulatory Visit: Payer: Self-pay | Admitting: Family Medicine

## 2022-06-06 DIAGNOSIS — N644 Mastodynia: Secondary | ICD-10-CM

## 2022-06-11 ENCOUNTER — Encounter: Payer: Self-pay | Admitting: Family Medicine

## 2022-06-11 ENCOUNTER — Ambulatory Visit: Payer: BC Managed Care – PPO

## 2022-06-11 ENCOUNTER — Ambulatory Visit
Admission: RE | Admit: 2022-06-11 | Discharge: 2022-06-11 | Disposition: A | Discharge status: Home / Self Care | Payer: BC Managed Care – PPO | Source: Ambulatory Visit | Attending: Family Medicine | Admitting: Family Medicine

## 2022-06-11 DIAGNOSIS — N644 Mastodynia: Secondary | ICD-10-CM

## 2022-06-11 DIAGNOSIS — I10 Essential (primary) hypertension: Secondary | ICD-10-CM

## 2022-06-11 MED ORDER — CHLORTHALIDONE 25 MG PO TABS: 25.0000 mg | ORAL_TABLET | Freq: Every day | ORAL | 1 refills | Status: DC

## 2022-06-21 ENCOUNTER — Other Ambulatory Visit: Payer: Self-pay | Admitting: Family Medicine

## 2022-06-21 DIAGNOSIS — E7800 Pure hypercholesterolemia, unspecified: Secondary | ICD-10-CM

## 2022-06-21 DIAGNOSIS — I2510 Atherosclerotic heart disease of native coronary artery without angina pectoris: Secondary | ICD-10-CM

## 2022-07-11 ENCOUNTER — Other Ambulatory Visit: Payer: Self-pay | Admitting: Family Medicine

## 2022-07-11 DIAGNOSIS — N401 Enlarged prostate with lower urinary tract symptoms: Secondary | ICD-10-CM

## 2022-07-20 ENCOUNTER — Other Ambulatory Visit: Payer: Self-pay | Admitting: Family Medicine

## 2022-07-20 DIAGNOSIS — I10 Essential (primary) hypertension: Secondary | ICD-10-CM

## 2022-07-29 ENCOUNTER — Encounter: Payer: Self-pay | Admitting: Family Medicine

## 2022-08-03 ENCOUNTER — Other Ambulatory Visit: Payer: Self-pay

## 2022-08-03 DIAGNOSIS — E7800 Pure hypercholesterolemia, unspecified: Secondary | ICD-10-CM

## 2022-08-03 DIAGNOSIS — I2510 Atherosclerotic heart disease of native coronary artery without angina pectoris: Secondary | ICD-10-CM

## 2022-08-03 MED ORDER — ASPIRIN 81 MG PO TBEC: 81.0000 mg | DELAYED_RELEASE_TABLET | Freq: Every day | ORAL | 16 refills | Status: AC

## 2022-08-03 MED ORDER — OMEPRAZOLE 20 MG PO CPDR: 20.0000 mg | DELAYED_RELEASE_CAPSULE | Freq: Every day | ORAL | 0 refills | Status: DC

## 2022-08-14 ENCOUNTER — Other Ambulatory Visit: Payer: BC Managed Care – PPO

## 2022-08-14 NOTE — Progress Notes (Signed)
08/14/2022 Name: Clifford Newman MRN: [id number] DOB: 09/30/1960  Chief Complaint  Patient presents with   Hypertension   DONTEZ CIOFFI is a 62 y.o. year old male who presented for a telephone visit.   They were referred to the pharmacist by a quality report for assistance in managing True [place] Metric:  Hypertension in [place] or African American population.   Patient is participating in a Managed Medicaid Plan: No  Subjective: Telephone visit to discuss medication management of hypertension and pre-diabetes Care Team: Primary Care Provider: Libby Maw, MD ; Next Scheduled Visit: 08/21/22  Medication Access/Adherence Patient reports affordability concerns with their medications: No  Patient reports access/transportation concerns to their pharmacy: No  Patient reports adherence concerns with their medications:  No    Pre-Diabetes: Current medications: Mounjaro 5mg  weekly  -Medications tried in the past: Ozempic- worked well but pharmacy did not have consistently; Victoza- patient did not tolerate side effects -Recently started [facility] and states he is tolerating okay, but it makes him feel dehydrated.  Would prefer to go back to Ozempic if his new pharmacy able to get. -Does not monitor BG at home  Hypertension: Current medications: chlorthalidone 25mg  daily  -Medications previously tried: spironolactone 50mg  but recently discontinued after patient developed breast tenderness (unknown if correlated) -Does not monitor BP at home  Objective: Lab Results  Component Value Date   HGBA1C 6.3 05/22/2022   Lab Results  Component Value Date   CREATININE 0.83 05/22/2022   BUN 23 05/22/2022   NA 140 05/22/2022   K 4.0 05/22/2022   CL 103 05/22/2022   CO2 27 05/22/2022   Lab Results  Component Value Date   CHOL 187 05/22/2022   HDL 80.30 05/22/2022   LDLCALC 93 05/22/2022   LDLDIRECT 108.0 09/08/2020   TRIG 67.0 05/22/2022   CHOLHDL 2 05/22/2022   Medications  Reviewed Today     Reviewed by Darlina Guys, RPH (Pharmacist) on 08/14/22 at 1541  Med List Status: <None>   Medication Order Taking? Sig Documenting Provider Last Dose Status Informant  aspirin EC (ASPIRIN LOW DOSE) 81 MG tablet [id number] Yes Take 1 tablet (81 mg total) by mouth daily. Libby Maw, MD Taking Active   atorvastatin (LIPITOR) 40 MG tablet [id number] Yes TAKE 1 TABLET BY MOUTH EVERY DAY Libby Maw, MD Taking Active   chlorthalidone (HYGROTON) 25 MG tablet [id number] Yes Take 1 tablet (25 mg total) by mouth daily. Libby Maw, MD Taking Active   fluticasone [facility]) 50 MCG/ACT nasal spray [id number] Yes SPRAY 2 SPRAYS INTO EACH NOSTRIL EVERY DAY Libby Maw, MD Taking Active            Med Note Colin Rhein, [facility] A   Tue Aug 14, 2022  3:38 PM) Seasonal as needed  omeprazole (PRILOSEC) 20 MG capsule [id number] Yes Take 1 capsule (20 mg total) by mouth daily. Libby Maw, MD Taking Active   tadalafil (CIALIS) 20 MG tablet [id number] Yes Take 0.5-1 tablets (10-20 mg total) by mouth every other day as needed for erectile dysfunction. Libby Maw, MD Taking Active            Med Note Colin Rhein, [place] A   Tue Aug 14, 2022  3:38 PM) As needed  tamsulosin [facility]) 0.4 MG CAPS capsule [id number] Yes TAKE 1 CAPSULE BY MOUTH EVERY DAY Libby Maw, MD Taking Active   tirzepatide [facility]) 5 MG/0.5ML Pen [id number] Yes Inject 5 mg into the skin once a  week. Libby Maw, MD Taking Active            Assessment/Plan:   Pre-Diabetes: - Currently uncontrolled - Recommend to inquire about availability of Ozempic at retail pharmacy and discuss switching back if available   Hypertension: - Currently uncontrolled - Recommend to evaluate at upcoming PCP appointment; could initiate ARB, such as losartan 25mg  daily to try to achieve goal of <130/80 if needed  Follow Up Plan: None needed at this time; Dr. Ethelene Hal  to contact if any future pharmacy needs  Darlina Guys, PharmD, DPLA

## 2022-08-16 ENCOUNTER — Other Ambulatory Visit: Payer: Self-pay | Admitting: Family Medicine

## 2022-08-16 DIAGNOSIS — E782 Mixed hyperlipidemia: Secondary | ICD-10-CM

## 2022-08-16 DIAGNOSIS — I2510 Atherosclerotic heart disease of native coronary artery without angina pectoris: Secondary | ICD-10-CM

## 2022-08-21 ENCOUNTER — Ambulatory Visit: Payer: BC Managed Care – PPO | Admitting: Family Medicine

## 2022-08-21 ENCOUNTER — Encounter: Payer: Self-pay | Admitting: Family Medicine

## 2022-08-21 VITALS — BP 118/66 | HR 88 | Temp 97.7°F | Ht 73.0 in | Wt 248.2 lb

## 2022-08-21 DIAGNOSIS — I10 Essential (primary) hypertension: Secondary | ICD-10-CM | POA: Diagnosis not present

## 2022-08-21 DIAGNOSIS — R7303 Prediabetes: Secondary | ICD-10-CM

## 2022-08-21 DIAGNOSIS — E876 Hypokalemia: Secondary | ICD-10-CM

## 2022-08-21 DIAGNOSIS — E7800 Pure hypercholesterolemia, unspecified: Secondary | ICD-10-CM | POA: Diagnosis not present

## 2022-08-21 NOTE — Progress Notes (Addendum)
Established Patient Office Visit   Subjective:  Patient ID: Clifford Newman, male    DOB: 1960/07/05  Age: 62 y.o. MRN: [id number]  Chief Complaint  Patient presents with   Medical Management of Chronic Issues    3 month follow up, no concerns.    HPI Encounter Diagnoses  Name Primary?   Prediabetes Yes   Essential hypertension    Elevated LDL cholesterol level    Hypokalemia    Doing quite well.  Has been able to lose 18 pounds between the [place] and and her departmental weight loss challenge involving [employer].  Has done well with the chlorthalidone.  Tolerating the tirzepatide.   Review of Systems  Constitutional: Negative.   HENT: Negative.    Eyes:  Negative for blurred vision, discharge and redness.  Respiratory: Negative.    Cardiovascular: Negative.   Gastrointestinal:  Negative for abdominal pain.  Genitourinary: Negative.   Musculoskeletal: Negative.  Negative for myalgias.  Skin:  Negative for rash.  Neurological:  Negative for tingling, loss of consciousness and weakness.  Endo/Heme/Allergies:  Negative for polydipsia.     Current Outpatient Medications:    aspirin EC (ASPIRIN LOW DOSE) 81 MG tablet, Take 1 tablet (81 mg total) by mouth daily., Disp: 90 tablet, Rfl: 16   atorvastatin (LIPITOR) 40 MG tablet, TAKE 1 TABLET BY MOUTH EVERY DAY, Disp: 90 tablet, Rfl: 3   chlorthalidone (HYGROTON) 25 MG tablet, Take 1 tablet (25 mg total) by mouth daily., Disp: 90 tablet, Rfl: 1   fluticasone (FLONASE) 50 MCG/ACT nasal spray, SPRAY 2 SPRAYS INTO EACH NOSTRIL EVERY DAY, Disp: 48 mL, Rfl: 2   omeprazole (PRILOSEC) 20 MG capsule, Take 1 capsule (20 mg total) by mouth daily., Disp: 90 capsule, Rfl: 0   potassium chloride SA (KLOR-CON M) 20 MEQ tablet, Take 1 tablet (20 mEq total) by mouth daily., Disp: 30 tablet, Rfl: 3   tadalafil (CIALIS) 20 MG tablet, Take 0.5-1 tablets (10-20 mg total) by mouth every other day as needed for erectile  dysfunction., Disp: 10 tablet, Rfl: 11   tamsulosin (FLOMAX) 0.4 MG CAPS capsule, TAKE 1 CAPSULE BY MOUTH EVERY DAY, Disp: 90 capsule, Rfl: 0   tirzepatide (MOUNJARO) 5 MG/0.5ML Pen, Inject 5 mg into the skin once a week., Disp: 6 mL, Rfl: 1   Objective:     BP 118/66 (BP Location: Right Arm, Patient Position: Sitting, Cuff Size: Large)   Pulse 88   Temp 97.7 F (36.5 C) (Temporal)   Ht 6\' 1"  (1.854 m)   Wt 248 lb 3.2 oz (112.6 kg)   SpO2 95%   BMI 32.75 kg/m  BP Readings from Last 3 Encounters:  08/21/22 118/66  05/22/22 (!) 142/78  11/24/21 130/86   Wt Readings from Last 3 Encounters:  08/21/22 248 lb 3.2 oz (112.6 kg)  05/22/22 265 lb 6.4 oz (120.4 kg)  11/24/21 268 lb 6.4 oz (121.7 kg)      Physical Exam Constitutional:      General: He is not in acute distress.    Appearance: Normal appearance. He is not ill-appearing, toxic-appearing or diaphoretic.  HENT:     Head: Normocephalic and atraumatic.     Right Ear: External ear normal.     Left Ear: External ear normal.  Eyes:     General: No scleral icterus.       Right eye: No discharge.        Left eye: No discharge.  Extraocular Movements: Extraocular movements intact.     Conjunctiva/sclera: Conjunctivae normal.  Pulmonary:     Effort: Pulmonary effort is normal. No respiratory distress.  Skin:    General: Skin is warm and dry.  Neurological:     Mental Status: He is alert and oriented to person, place, and time.  Psychiatric:        Mood and Affect: Mood normal.        Behavior: Behavior normal.      Results for orders placed or performed in visit on 08/21/22  Basic metabolic panel  Result Value Ref Range   Sodium 134 (L) 135 - 145 mEq/L   Potassium 3.2 (L) 3.5 - 5.1 mEq/L   Chloride 97 96 - 112 mEq/L   CO2 28 19 - 32 mEq/L   Glucose, Bld 102 (H) 70 - 99 mg/dL   BUN 23 6 - 23 mg/dL   Creatinine, Ser 1.61 0.40 - 1.50 mg/dL   GFR 09.60 >45.40 mL/min   Calcium 9.7 8.4 - 10.5 mg/dL  Hemoglobin  J8J  Result Value Ref Range   Hgb A1c MFr Bld 6.2 4.6 - 6.5 %  LDL cholesterol, direct  Result Value Ref Range   Direct LDL 101.0 mg/dL      The 19-JYNW ASCVD risk score (Arnett DK, et al., 2019) is: 11.1%    Assessment & Plan:   Prediabetes -     Basic metabolic panel -     Hemoglobin A1c  Essential hypertension -     Basic metabolic panel  Elevated LDL cholesterol level -     LDL cholesterol, direct  Hypokalemia -     Potassium Chloride Crys ER; Take 1 tablet (20 mEq total) by mouth daily.  Dispense: 30 tablet; Refill: 3    Return in about 3 months (around 11/20/2022), or Continue weight loss efforts.  Keep up the good work.Mliss Sax, MD

## 2022-08-22 LAB — LDL CHOLESTEROL, DIRECT: Direct LDL: 101 mg/dL

## 2022-08-22 LAB — BASIC METABOLIC PANEL
BUN: 23 mg/dL (ref 6–23)
CO2: 28 mEq/L (ref 19–32)
Calcium: 9.7 mg/dL (ref 8.4–10.5)
Chloride: 97 mEq/L (ref 96–112)
Creatinine, Ser: 0.99 mg/dL (ref 0.40–1.50)
GFR: 80.85 mL/min (ref >60.00)
Glucose, Bld: 102 mg/dL — ABNORMAL HIGH (ref 70–99)
Potassium: 3.2 mEq/L — ABNORMAL LOW (ref 3.5–5.1)
Sodium: 134 mEq/L — ABNORMAL LOW (ref 135–145)

## 2022-08-22 LAB — HEMOGLOBIN A1C: Hgb A1c MFr Bld: 6.2 % (ref 4.6–6.5)

## 2022-08-27 ENCOUNTER — Encounter: Payer: Self-pay | Admitting: Family Medicine

## 2022-08-27 MED ORDER — POTASSIUM CHLORIDE CRYS ER 20 MEQ PO TBCR: 20.0000 meq | EXTENDED_RELEASE_TABLET | Freq: Every day | ORAL | 3 refills | Status: DC

## 2022-08-27 NOTE — Addendum Note (Signed)
Addended by: Andrez Grime on: 08/27/2022 08:08 AM   Modules accepted: Orders

## 2022-09-11 ENCOUNTER — Ambulatory Visit (INDEPENDENT_AMBULATORY_CARE_PROVIDER_SITE_OTHER): Payer: BC Managed Care – PPO

## 2022-09-11 ENCOUNTER — Telehealth: Payer: Self-pay

## 2022-09-11 ENCOUNTER — Encounter: Payer: Self-pay | Admitting: Family Medicine

## 2022-09-11 ENCOUNTER — Other Ambulatory Visit: Payer: Self-pay

## 2022-09-11 VITALS — BP 102/80 | HR 83 | Temp 97.9°F | Resp 16 | Ht 73.0 in | Wt 248.0 lb

## 2022-09-11 DIAGNOSIS — I2510 Atherosclerotic heart disease of native coronary artery without angina pectoris: Secondary | ICD-10-CM

## 2022-09-11 DIAGNOSIS — Z23 Encounter for immunization: Secondary | ICD-10-CM | POA: Diagnosis not present

## 2022-09-11 DIAGNOSIS — R7303 Prediabetes: Secondary | ICD-10-CM

## 2022-09-11 DIAGNOSIS — E782 Mixed hyperlipidemia: Secondary | ICD-10-CM

## 2022-09-11 MED ORDER — ATORVASTATIN CALCIUM 40 MG PO TABS: 40.0000 mg | ORAL_TABLET | Freq: Every day | ORAL | 3 refills | Status: DC

## 2022-09-11 MED ORDER — TIRZEPATIDE 5 MG/0.5ML ~~LOC~~ SOAJ: 5.0000 mg | SUBCUTANEOUS | 1 refills | Status: DC

## 2022-09-11 NOTE — Telephone Encounter (Signed)
After obtaining consent, and per orders of Dr. Kremer, injection of Shingrix given by Daveion Robar  Silman. Patient instructed to remain in clinic for 20 minutes afterwards, and to report any adverse reaction to me immediately.  

## 2022-09-11 NOTE — Progress Notes (Signed)
After obtaining consent, and per orders of Dr. Doreene Burke, injection of Shingrix given by Olga Coaster. Patient instructed to remain in clinic for 20 minutes afterwards, and to report any adverse reaction to me immediately.

## 2022-09-27 ENCOUNTER — Telehealth: Payer: Self-pay

## 2022-09-27 NOTE — Telephone Encounter (Signed)
PA approved.   Your PA request has been approved. Additional information will be provided in the approval communication. (Message 1145) Authorization Expiration Date: 09/25/2025 

## 2022-09-27 NOTE — Telephone Encounter (Signed)
PA initiated via Covermymeds; KEY: [id number]. Awaiting determination.

## 2022-10-26 ENCOUNTER — Other Ambulatory Visit: Payer: Self-pay | Admitting: Family Medicine

## 2022-10-27 ENCOUNTER — Other Ambulatory Visit: Payer: Self-pay | Admitting: Family Medicine

## 2022-10-27 DIAGNOSIS — N401 Enlarged prostate with lower urinary tract symptoms: Secondary | ICD-10-CM

## 2022-10-29 ENCOUNTER — Other Ambulatory Visit: Payer: Self-pay | Admitting: Family Medicine

## 2022-10-29 DIAGNOSIS — N401 Enlarged prostate with lower urinary tract symptoms: Secondary | ICD-10-CM

## 2022-10-30 ENCOUNTER — Telehealth: Payer: Self-pay | Admitting: Family Medicine

## 2022-10-30 NOTE — Telephone Encounter (Signed)
Pt is wondering why his tamsulosin (FLOMAX) 0.4 MG CAPS capsule [[id number]] has been denied. Please advise pt at [phone].

## 2022-10-30 NOTE — Telephone Encounter (Signed)
Patient notified that RX was sent to the pharmacy on 10/29/22.  Dm/cma

## 2022-11-25 ENCOUNTER — Other Ambulatory Visit: Payer: Self-pay | Admitting: Family Medicine

## 2022-11-25 DIAGNOSIS — N5201 Erectile dysfunction due to arterial insufficiency: Secondary | ICD-10-CM

## 2022-12-01 ENCOUNTER — Other Ambulatory Visit: Payer: Self-pay | Admitting: Family Medicine

## 2022-12-01 DIAGNOSIS — I10 Essential (primary) hypertension: Secondary | ICD-10-CM

## 2022-12-15 ENCOUNTER — Other Ambulatory Visit: Payer: Self-pay | Admitting: Family Medicine

## 2022-12-15 DIAGNOSIS — E876 Hypokalemia: Secondary | ICD-10-CM

## 2022-12-24 ENCOUNTER — Ambulatory Visit (HOSPITAL_COMMUNITY)
Admission: RE | Admit: 2022-12-24 | Discharge: 2022-12-24 | Disposition: A | Discharge status: Home / Self Care | Payer: BC Managed Care – PPO | Source: Ambulatory Visit

## 2022-12-24 DIAGNOSIS — Z122 Encounter for screening for malignant neoplasm of respiratory organs: Secondary | ICD-10-CM | POA: Diagnosis present

## 2022-12-24 DIAGNOSIS — Z87891 Personal history of nicotine dependence: Secondary | ICD-10-CM

## 2022-12-24 DIAGNOSIS — F17210 Nicotine dependence, cigarettes, uncomplicated: Secondary | ICD-10-CM | POA: Insufficient documentation

## 2022-12-28 ENCOUNTER — Other Ambulatory Visit: Payer: Self-pay | Admitting: Family Medicine

## 2022-12-28 ENCOUNTER — Other Ambulatory Visit: Payer: Self-pay | Admitting: Acute Care

## 2022-12-28 DIAGNOSIS — Z87891 Personal history of nicotine dependence: Secondary | ICD-10-CM

## 2022-12-28 DIAGNOSIS — N5201 Erectile dysfunction due to arterial insufficiency: Secondary | ICD-10-CM

## 2022-12-28 DIAGNOSIS — Z122 Encounter for screening for malignant neoplasm of respiratory organs: Secondary | ICD-10-CM

## 2023-01-22 ENCOUNTER — Other Ambulatory Visit: Payer: Self-pay | Admitting: Family Medicine

## 2023-01-22 DIAGNOSIS — N401 Enlarged prostate with lower urinary tract symptoms: Secondary | ICD-10-CM

## 2023-02-20 ENCOUNTER — Encounter: Payer: Self-pay | Admitting: Family Medicine

## 2023-02-20 ENCOUNTER — Ambulatory Visit: Payer: BC Managed Care – PPO | Admitting: Family Medicine

## 2023-02-20 VITALS — BP 126/72 | HR 87 | Temp 97.6°F | Ht 73.0 in | Wt 235.4 lb

## 2023-02-20 DIAGNOSIS — R7303 Prediabetes: Secondary | ICD-10-CM

## 2023-02-20 DIAGNOSIS — Z6837 Body mass index (BMI) 37.0-37.9, adult: Secondary | ICD-10-CM

## 2023-02-20 DIAGNOSIS — E7800 Pure hypercholesterolemia, unspecified: Secondary | ICD-10-CM

## 2023-02-20 DIAGNOSIS — E876 Hypokalemia: Secondary | ICD-10-CM | POA: Insufficient documentation

## 2023-02-20 DIAGNOSIS — I10 Essential (primary) hypertension: Secondary | ICD-10-CM | POA: Diagnosis not present

## 2023-02-20 DIAGNOSIS — Z23 Encounter for immunization: Secondary | ICD-10-CM | POA: Diagnosis not present

## 2023-02-20 LAB — LIPID PANEL
Cholesterol: 187 mg/dL (ref 0–200)
HDL: 85.4 mg/dL (ref >39.00)
LDL Cholesterol: 89 mg/dL (ref 0–99)
NonHDL: 101.66
Total CHOL/HDL Ratio: 2
Triglycerides: 61 mg/dL (ref 0.0–149.0)
VLDL: 12.2 mg/dL (ref 0.0–40.0)

## 2023-02-20 LAB — BASIC METABOLIC PANEL
BUN: 19 mg/dL (ref 6–23)
CO2: 28 meq/L (ref 19–32)
Calcium: 9.7 mg/dL (ref 8.4–10.5)
Chloride: 101 meq/L (ref 96–112)
Creatinine, Ser: 0.82 mg/dL (ref 0.40–1.50)
GFR: 92.9 mL/min (ref >60.00)
Glucose, Bld: 94 mg/dL (ref 70–99)
Potassium: 3.8 meq/L (ref 3.5–5.1)
Sodium: 138 meq/L (ref 135–145)

## 2023-02-20 LAB — HEMOGLOBIN A1C: Hgb A1c MFr Bld: 6.1 % (ref 4.6–6.5)

## 2023-02-20 NOTE — Progress Notes (Signed)
Established Patient Office Visit   Subjective:  Patient ID: Clifford Newman, male    DOB: Mar 13, 1961  Age: 62 y.o. MRN: [id number]  Chief Complaint  Patient presents with   Medical Management of Chronic Issues    Weight loss follow up. Pt states he is now taking his Mounjaro twice a month now.     HPI Encounter Diagnoses  Name Primary?   Prediabetes Yes   Class 2 severe obesity due to excess calories with serious comorbidity and body mass index (BMI) of 37.0 to 37.9 in adult [facility])    Essential hypertension    Need for immunization against influenza    Elevated LDL cholesterol level    Hypokalemia    Follow-up of above.  Continues weight loss journey.  Has been able to lose 13 pounds he is exercising 3 hours weekly.  Now taking Mounjaro every 2 weeks.  This seems to be working for him.  Continues atorvastatin 40 mg for elevated cholesterol and chlorthalidone for blood pressure control.   Review of Systems  Constitutional: Negative.   HENT: Negative.    Eyes:  Negative for blurred vision, discharge and redness.  Respiratory: Negative.    Cardiovascular: Negative.   Gastrointestinal:  Negative for abdominal pain.  Genitourinary: Negative.   Musculoskeletal: Negative.  Negative for myalgias.  Skin:  Negative for rash.  Neurological:  Negative for tingling, loss of consciousness and weakness.  Endo/Heme/Allergies:  Negative for polydipsia.     Current Outpatient Medications:    aspirin EC (ASPIRIN LOW DOSE) 81 MG tablet, Take 1 tablet (81 mg total) by mouth daily., Disp: 90 tablet, Rfl: 16   atorvastatin (LIPITOR) 40 MG tablet, Take 1 tablet (40 mg total) by mouth daily., Disp: 90 tablet, Rfl: 3   chlorthalidone (HYGROTON) 25 MG tablet, Take 1 tablet by mouth once daily, Disp: 90 tablet, Rfl: 0   fluticasone (FLONASE) 50 MCG/ACT nasal spray, SPRAY 2 SPRAYS INTO EACH NOSTRIL EVERY DAY, Disp: 48 mL, Rfl: 2   omeprazole (PRILOSEC) 20 MG capsule, Take 1 capsule by mouth once  daily, Disp: 90 capsule, Rfl: 1   potassium chloride SA (KLOR-CON M20) 20 MEQ tablet, Take 1 tablet by mouth once daily, Disp: 30 tablet, Rfl: 2   tadalafil (CIALIS) 20 MG tablet, TAKE 1/2 TO 1 (ONE-HALF TO ONE) TABLET BY MOUTH EVERY OTHER DAY AS NEEDED FOR ERECTILE DYSFUNCTION, Disp: 10 tablet, Rfl: 3   tamsulosin (FLOMAX) 0.4 MG CAPS capsule, Take 1 capsule by mouth once daily, Disp: 90 capsule, Rfl: 3   tirzepatide (MOUNJARO) 5 MG/0.5ML Pen, Inject 5 mg into the skin once a week., Disp: 6 mL, Rfl: 1   Objective:     BP 126/72   Pulse 87   Temp 97.6 F (36.4 C)   Ht 6\' 1"  (1.854 m)   Wt 235 lb 6.4 oz (106.8 kg)   SpO2 96%   BMI 31.06 kg/m  BP Readings from Last 3 Encounters:  02/20/23 126/72  09/11/22 102/80  08/21/22 118/66   Wt Readings from Last 3 Encounters:  02/20/23 235 lb 6.4 oz (106.8 kg)  09/11/22 248 lb (112.5 kg)  08/21/22 248 lb 3.2 oz (112.6 kg)      Physical Exam Constitutional:      General: He is not in acute distress.    Appearance: Normal appearance. He is not ill-appearing, toxic-appearing or diaphoretic.  HENT:     Head: Normocephalic and atraumatic.     Right Ear: External ear normal.  Left Ear: External ear normal.  Eyes:     General: No scleral icterus.       Right eye: No discharge.        Left eye: No discharge.     Extraocular Movements: Extraocular movements intact.     Conjunctiva/sclera: Conjunctivae normal.  Pulmonary:     Effort: Pulmonary effort is normal. No respiratory distress.  Skin:    General: Skin is warm and dry.  Neurological:     Mental Status: He is alert and oriented to person, place, and time.  Psychiatric:        Mood and Affect: Mood normal.        Behavior: Behavior normal.      No results found for any visits on 02/20/23.    The 10-year ASCVD risk score (Arnett DK, et al., 2019) is: 13%    Assessment & Plan:   Prediabetes -     Basic metabolic panel -     Hemoglobin A1c  Class 2 severe obesity  due to excess calories with serious comorbidity and body mass index (BMI) of 37.0 to 37.9 in adult [facility])  Essential hypertension -     Basic metabolic panel  Need for immunization against influenza -     Flu vaccine trivalent PF, 6mos and older(Flulaval,Afluria,Fluarix,Fluzone)  Elevated LDL cholesterol level -     Lipid panel  Hypokalemia -     Basic metabolic panel    Return in about 6 months (around 08/21/2023), or if symptoms worsen or fail to improve.  Continue active healthy lifestyle.  Continue all medications as above.   Mliss Sax, MD

## 2023-02-26 ENCOUNTER — Other Ambulatory Visit: Payer: Self-pay | Admitting: Family

## 2023-02-26 DIAGNOSIS — I10 Essential (primary) hypertension: Secondary | ICD-10-CM

## 2023-03-02 ENCOUNTER — Other Ambulatory Visit: Payer: Self-pay | Admitting: Family Medicine

## 2023-03-02 DIAGNOSIS — I10 Essential (primary) hypertension: Secondary | ICD-10-CM

## 2023-03-04 MED ORDER — CHLORTHALIDONE 25 MG PO TABS: 25.0000 mg | ORAL_TABLET | Freq: Every day | ORAL | 3 refills | Status: DC

## 2023-03-14 ENCOUNTER — Other Ambulatory Visit: Payer: Self-pay | Admitting: Family Medicine

## 2023-03-14 DIAGNOSIS — E876 Hypokalemia: Secondary | ICD-10-CM

## 2023-04-10 ENCOUNTER — Other Ambulatory Visit: Payer: Self-pay | Admitting: Family Medicine

## 2023-04-10 DIAGNOSIS — E876 Hypokalemia: Secondary | ICD-10-CM

## 2023-04-19 ENCOUNTER — Other Ambulatory Visit: Payer: Self-pay | Admitting: Family Medicine

## 2023-05-06 ENCOUNTER — Other Ambulatory Visit: Payer: Self-pay | Admitting: Orthopaedic Surgery

## 2023-05-06 DIAGNOSIS — M25512 Pain in left shoulder: Secondary | ICD-10-CM

## 2023-05-08 ENCOUNTER — Other Ambulatory Visit: Payer: Self-pay | Admitting: Family Medicine

## 2023-05-08 DIAGNOSIS — E876 Hypokalemia: Secondary | ICD-10-CM

## 2023-05-13 ENCOUNTER — Telehealth: Payer: Self-pay | Admitting: Family Medicine

## 2023-05-13 NOTE — Telephone Encounter (Signed)
Prescription Request  05/13/2023  LOV: 02/20/2023  What is the name of the medication or equipment? chlorthalidone (HYGROTON) 25 MG tablet [[id number]]   Have you contacted your pharmacy to request a refill? No   Which pharmacy would you like this sent to?  [facility] [address] ([address] [place] - [address] [place]) [place] [address] Phone: [phone] Fax: [phone]    Patient notified that their request is being sent to the clinical staff for review and that they should receive a response within 2 business days.   Please advise at Mobile [phone] (mobile)

## 2023-05-30 ENCOUNTER — Ambulatory Visit
Admission: RE | Admit: 2023-05-30 | Discharge: 2023-05-30 | Disposition: A | Discharge status: Home / Self Care | Payer: Worker's Compensation | Source: Ambulatory Visit | Attending: Orthopaedic Surgery | Admitting: Orthopaedic Surgery

## 2023-05-30 DIAGNOSIS — M25512 Pain in left shoulder: Secondary | ICD-10-CM

## 2023-06-24 ENCOUNTER — Ambulatory Visit: Payer: Self-pay | Admitting: Family Medicine

## 2023-06-24 ENCOUNTER — Encounter: Payer: Self-pay | Admitting: Family Medicine

## 2023-06-24 VITALS — BP 132/84 | HR 64 | Temp 98.0°F | Ht 73.0 in | Wt 235.8 lb

## 2023-06-24 DIAGNOSIS — E7800 Pure hypercholesterolemia, unspecified: Secondary | ICD-10-CM | POA: Diagnosis not present

## 2023-06-24 DIAGNOSIS — R7303 Prediabetes: Secondary | ICD-10-CM | POA: Diagnosis not present

## 2023-06-24 DIAGNOSIS — I10 Essential (primary) hypertension: Secondary | ICD-10-CM | POA: Diagnosis not present

## 2023-06-24 DIAGNOSIS — E876 Hypokalemia: Secondary | ICD-10-CM | POA: Diagnosis not present

## 2023-06-24 LAB — BASIC METABOLIC PANEL
BUN: 16 mg/dL (ref 6–23)
CO2: 28 meq/L (ref 19–32)
Calcium: 9.7 mg/dL (ref 8.4–10.5)
Chloride: 97 meq/L (ref 96–112)
Creatinine, Ser: 1.06 mg/dL (ref 0.40–1.50)
GFR: 74.05 mL/min (ref >60.00)
Glucose, Bld: 90 mg/dL (ref 70–99)
Potassium: 3.1 meq/L — ABNORMAL LOW (ref 3.5–5.1)
Sodium: 135 meq/L (ref 135–145)

## 2023-06-24 LAB — LDL CHOLESTEROL, DIRECT: Direct LDL: 88 mg/dL

## 2023-06-24 LAB — HEMOGLOBIN A1C: Hgb A1c MFr Bld: 6.2 % (ref 4.6–6.5)

## 2023-06-24 NOTE — Progress Notes (Signed)
Established Patient Office Visit   Subjective:  Patient ID: Clifford Newman, male    DOB: [birth year]  Age: 63 y.o. MRN: [id number]  Chief Complaint  Patient presents with   surgical clearance    Pt needs clearance for Left shoulder surgery. Dewaine Conger.     HPI Encounter Diagnoses  Name Primary?   Prediabetes Yes   Essential hypertension    Elevated LDL cholesterol level    Presurgical clearance for left shoulder reverse arthroplasty.  Shoulder is been quite painful and he is ready to do it.  Will be out of work for 12 weeks.  Blood pressure and diabetes have been well-controlled on chlorthalidone and tirzepatide.  Last LDL was 89 on 40 of atorvastatin.  He has lowered the fat and cholesterol in his diet.   Review of Systems  Constitutional: Negative.   HENT: Negative.    Eyes:  Negative for blurred vision, discharge and redness.  Respiratory: Negative.    Cardiovascular: Negative.   Gastrointestinal:  Negative for abdominal pain.  Genitourinary: Negative.   Musculoskeletal:  Positive for joint pain. Negative for myalgias.  Skin:  Negative for rash.  Neurological:  Negative for tingling, loss of consciousness and weakness.  Endo/Heme/Allergies:  Negative for polydipsia.     Current Outpatient Medications:    aspirin EC (ASPIRIN LOW DOSE) 81 MG tablet, Take 1 tablet (81 mg total) by mouth daily., Disp: 90 tablet, Rfl: 16   atorvastatin (LIPITOR) 40 MG tablet, Take 1 tablet (40 mg total) by mouth daily., Disp: 90 tablet, Rfl: 3   chlorthalidone (HYGROTON) 25 MG tablet, Take 1 tablet (25 mg total) by mouth daily., Disp: 90 tablet, Rfl: 3   fluticasone (FLONASE) 50 MCG/ACT nasal spray, SPRAY 2 SPRAYS INTO EACH NOSTRIL EVERY DAY, Disp: 48 mL, Rfl: 2   omeprazole (PRILOSEC) 20 MG capsule, Take 1 capsule by mouth once daily, Disp: 90 capsule, Rfl: 0   potassium chloride SA (KLOR-CON M20) 20 MEQ tablet, Take 1 tablet by mouth once daily, Disp: 90 tablet, Rfl: 2   tadalafil  (CIALIS) 20 MG tablet, TAKE 1/2 TO 1 (ONE-HALF TO ONE) TABLET BY MOUTH EVERY OTHER DAY AS NEEDED FOR ERECTILE DYSFUNCTION, Disp: 10 tablet, Rfl: 3   tamsulosin (FLOMAX) 0.4 MG CAPS capsule, Take 1 capsule by mouth once daily, Disp: 90 capsule, Rfl: 3   tirzepatide (MOUNJARO) 5 MG/0.5ML Pen, Inject 5 mg into the skin once a week., Disp: 6 mL, Rfl: 1   Objective:     BP 132/84   Pulse 64   Temp 98 F (36.7 C)   Ht 6\' 1"  (1.854 m)   Wt 235 lb 12.8 oz (107 kg)   SpO2 98%   BMI 31.11 kg/m    Physical Exam Constitutional:      General: He is not in acute distress.    Appearance: Normal appearance. He is not ill-appearing, toxic-appearing or diaphoretic.  HENT:     Head: Normocephalic and atraumatic.     Right Ear: External ear normal.     Left Ear: External ear normal.  Eyes:     General: No scleral icterus.       Right eye: No discharge.        Left eye: No discharge.     Extraocular Movements: Extraocular movements intact.     Conjunctiva/sclera: Conjunctivae normal.  Pulmonary:     Effort: Pulmonary effort is normal. No respiratory distress.  Skin:    General: Skin is warm and dry.  Neurological:     Mental Status: He is alert and oriented to person, place, and time.  Psychiatric:        Mood and Affect: Mood normal.        Behavior: Behavior normal.      No results found for any visits on 06/24/23.    The 10-year ASCVD risk score (Arnett DK, et al., 2019) is: 13.8%    Assessment & Plan:   Prediabetes -     Basic metabolic panel -     Hemoglobin A1c  Essential hypertension -     Basic metabolic panel  Elevated LDL cholesterol level -     LDL cholesterol, direct    No follow-ups on file.  Rechecking labs.  Discussed the possibility of increasing atorvastatin to 80.  Labs are pending.  Okay to have surgery.  Mliss Sax, MD

## 2023-07-01 ENCOUNTER — Other Ambulatory Visit: Payer: Self-pay | Admitting: Orthopaedic Surgery

## 2023-07-01 DIAGNOSIS — Z01818 Encounter for other preprocedural examination: Secondary | ICD-10-CM

## 2023-07-01 DIAGNOSIS — G8929 Other chronic pain: Secondary | ICD-10-CM

## 2023-07-02 ENCOUNTER — Ambulatory Visit
Admission: RE | Admit: 2023-07-02 | Discharge: 2023-07-02 | Disposition: A | Discharge status: Home / Self Care | Payer: 59 | Source: Ambulatory Visit | Attending: Orthopaedic Surgery | Admitting: Orthopaedic Surgery

## 2023-07-02 ENCOUNTER — Other Ambulatory Visit: Payer: Worker's Compensation

## 2023-07-02 ENCOUNTER — Telehealth: Payer: Self-pay | Admitting: Family Medicine

## 2023-07-02 DIAGNOSIS — Z01818 Encounter for other preprocedural examination: Secondary | ICD-10-CM

## 2023-07-02 DIAGNOSIS — G8929 Other chronic pain: Secondary | ICD-10-CM

## 2023-07-02 NOTE — Telephone Encounter (Signed)
Copied from CRM [id number]. Topic: Referral - Question >> Jul 02, 2023  8:52 AM Josefa Half C wrote: Reason for CRM: Sherri with [facility] called in about medical clearance for the patients surgery, informed caller there was no updated status showing for the medical clearance. Sherri said she needs the clearance before 07/10/2023 because that is the day she is trying to get him scheduled for surgery. Please follow up with [facility] with an updated status [phone]

## 2023-07-18 ENCOUNTER — Other Ambulatory Visit (HOSPITAL_COMMUNITY): Payer: Self-pay

## 2023-07-18 ENCOUNTER — Other Ambulatory Visit: Payer: Self-pay | Admitting: Family Medicine

## 2023-08-22 ENCOUNTER — Ambulatory Visit: Payer: BC Managed Care – PPO | Admitting: Family Medicine

## 2023-08-26 ENCOUNTER — Encounter: Payer: Self-pay | Admitting: Family Medicine

## 2023-08-26 ENCOUNTER — Ambulatory Visit (INDEPENDENT_AMBULATORY_CARE_PROVIDER_SITE_OTHER): Payer: BC Managed Care – PPO | Admitting: Family Medicine

## 2023-08-26 VITALS — BP 124/72 | HR 100 | Temp 98.1°F | Ht 73.0 in | Wt 241.6 lb

## 2023-08-26 DIAGNOSIS — E876 Hypokalemia: Secondary | ICD-10-CM

## 2023-08-26 DIAGNOSIS — J301 Allergic rhinitis due to pollen: Secondary | ICD-10-CM | POA: Diagnosis not present

## 2023-08-26 LAB — POTASSIUM: Potassium: 3.3 meq/L — ABNORMAL LOW (ref 3.5–5.1)

## 2023-08-26 MED ORDER — FLUTICASONE PROPIONATE 50 MCG/ACT NA SUSP: 2.0000 | Freq: Every day | NASAL | 2 refills | Status: AC

## 2023-08-26 NOTE — Progress Notes (Signed)
 Established Patient Office Visit   Subjective:  Patient ID: Clifford Newman, male    DOB: August 18, 1960  Age: 63 y.o. MRN: [id number]  Chief Complaint  Patient presents with   Medical Management of Chronic Issues    Potasium recheck. 3.1 06/24/23.    HPI Encounter Diagnoses  Name Primary?   Hypokalemia Yes   Allergic rhinitis due to pollen, unspecified seasonality    Follow-up as above.  Has been taking his potassium daily.  Blood pressure is well-controlled with it.  Continues Flonase for allergic rhinitis   Review of Systems  Constitutional: Negative.   HENT: Negative.    Eyes:  Negative for blurred vision, discharge and redness.  Respiratory: Negative.    Cardiovascular: Negative.   Gastrointestinal:  Negative for abdominal pain.  Genitourinary: Negative.   Musculoskeletal: Negative.  Negative for myalgias.  Skin:  Negative for rash.  Neurological:  Negative for tingling, loss of consciousness and weakness.  Endo/Heme/Allergies:  Negative for polydipsia.     Current Outpatient Medications:    aspirin EC (ASPIRIN LOW DOSE) 81 MG tablet, Take 1 tablet (81 mg total) by mouth daily., Disp: 90 tablet, Rfl: 16   atorvastatin (LIPITOR) 40 MG tablet, Take 1 tablet (40 mg total) by mouth daily., Disp: 90 tablet, Rfl: 3   chlorthalidone (HYGROTON) 25 MG tablet, Take 1 tablet (25 mg total) by mouth daily., Disp: 90 tablet, Rfl: 3   omeprazole (PRILOSEC) 20 MG capsule, Take 1 capsule by mouth once daily, Disp: 90 capsule, Rfl: 0   tadalafil (CIALIS) 20 MG tablet, TAKE 1/2 TO 1 (ONE-HALF TO ONE) TABLET BY MOUTH EVERY OTHER DAY AS NEEDED FOR ERECTILE DYSFUNCTION, Disp: 10 tablet, Rfl: 3   tamsulosin (FLOMAX) 0.4 MG CAPS capsule, Take 1 capsule by mouth once daily, Disp: 90 capsule, Rfl: 3   tirzepatide (MOUNJARO) 5 MG/0.5ML Pen, Inject 5 mg into the skin once a week., Disp: 6 mL, Rfl: 1   fluticasone (FLONASE) 50 MCG/ACT nasal spray, Place 2 sprays into both nostrils daily., Disp: 48 mL,  Rfl: 2   potassium chloride SA (KLOR-CON M20) 20 MEQ tablet, Take 1 tablet by mouth once daily, Disp: 90 tablet, Rfl: 2   Objective:     BP 124/72 (BP Location: Right Arm, Patient Position: Sitting, Cuff Size: Normal)   Pulse 100   Temp 98.1 F (36.7 C) (Temporal)   Ht 6\' 1"  (1.854 m)   Wt 241 lb 9.6 oz (109.6 kg)   SpO2 96%   BMI 31.88 kg/m    Physical Exam Constitutional:      General: He is not in acute distress.    Appearance: Normal appearance. He is not ill-appearing, toxic-appearing or diaphoretic.  HENT:     Head: Normocephalic and atraumatic.     Right Ear: External ear normal.     Left Ear: External ear normal.  Eyes:     General: No scleral icterus.       Right eye: No discharge.        Left eye: No discharge.     Extraocular Movements: Extraocular movements intact.     Conjunctiva/sclera: Conjunctivae normal.  Pulmonary:     Effort: Pulmonary effort is normal. No respiratory distress.  Skin:    General: Skin is warm and dry.  Neurological:     Mental Status: He is alert and oriented to person, place, and time.  Psychiatric:        Mood and Affect: Mood normal.  Behavior: Behavior normal.      No results found for any visits on 08/26/23.    The 10-year ASCVD risk score (Arnett DK, et al., 2019) is: 12.4%    Assessment & Plan:   Hypokalemia -     Potassium  Allergic rhinitis due to pollen, unspecified seasonality -     Fluticasone Propionate; Place 2 sprays into both nostrils daily.  Dispense: 48 mL; Refill: 2    Return in about 3 months (around 11/25/2023).  Rechecking the potassium.  Discussed strategies for taking potassium pills.  Information was given on hypokalemia and increasing potassium in the diet.  Continue fluticasone for allergy rhinitis because it is working for him.  Mliss Sax, MD

## 2023-08-27 MED ORDER — POTASSIUM CHLORIDE CRYS ER 20 MEQ PO TBCR: 20.0000 meq | EXTENDED_RELEASE_TABLET | Freq: Two times a day (BID) | ORAL | 1 refills | Status: DC

## 2023-08-27 NOTE — Addendum Note (Signed)
 Addended by: Andrez Grime on: 08/27/2023 07:59 AM   Modules accepted: Orders

## 2023-09-01 ENCOUNTER — Other Ambulatory Visit: Payer: Self-pay | Admitting: Family Medicine

## 2023-09-01 DIAGNOSIS — E782 Mixed hyperlipidemia: Secondary | ICD-10-CM

## 2023-09-01 DIAGNOSIS — I2510 Atherosclerotic heart disease of native coronary artery without angina pectoris: Secondary | ICD-10-CM

## 2023-10-16 ENCOUNTER — Other Ambulatory Visit: Payer: Self-pay | Admitting: Family Medicine

## 2023-10-20 ENCOUNTER — Other Ambulatory Visit: Payer: Self-pay | Admitting: Family Medicine

## 2023-11-16 ENCOUNTER — Other Ambulatory Visit: Payer: Self-pay | Admitting: Family Medicine

## 2023-11-16 DIAGNOSIS — E876 Hypokalemia: Secondary | ICD-10-CM

## 2023-11-16 DIAGNOSIS — I2510 Atherosclerotic heart disease of native coronary artery without angina pectoris: Secondary | ICD-10-CM

## 2023-11-16 DIAGNOSIS — E782 Mixed hyperlipidemia: Secondary | ICD-10-CM

## 2023-11-18 MED ORDER — ATORVASTATIN CALCIUM 40 MG PO TABS
40.0000 mg | ORAL_TABLET | Freq: Every day | ORAL | 3 refills | Status: DC
Start: 2023-11-18 — End: 2023-11-26

## 2023-11-18 NOTE — Addendum Note (Signed)
 Addended by: BERNETA ELSIE LABOR on: 11/18/2023 01:19 PM   Modules accepted: Orders

## 2023-11-25 ENCOUNTER — Encounter: Payer: Self-pay | Admitting: Family Medicine

## 2023-11-25 ENCOUNTER — Ambulatory Visit (INDEPENDENT_AMBULATORY_CARE_PROVIDER_SITE_OTHER): Admitting: Family Medicine

## 2023-11-25 VITALS — BP 124/80 | HR 80 | Temp 97.1°F | Ht 73.0 in | Wt 230.2 lb

## 2023-11-25 DIAGNOSIS — R7303 Prediabetes: Secondary | ICD-10-CM

## 2023-11-25 DIAGNOSIS — I10 Essential (primary) hypertension: Secondary | ICD-10-CM

## 2023-11-25 DIAGNOSIS — E876 Hypokalemia: Secondary | ICD-10-CM | POA: Diagnosis not present

## 2023-11-25 DIAGNOSIS — E7800 Pure hypercholesterolemia, unspecified: Secondary | ICD-10-CM | POA: Diagnosis not present

## 2023-11-25 MED ORDER — POTASSIUM CHLORIDE CRYS ER 20 MEQ PO TBCR: 20.0000 meq | EXTENDED_RELEASE_TABLET | Freq: Every day | ORAL | 0 refills | Status: DC

## 2023-11-25 NOTE — Progress Notes (Signed)
 Established Patient Office Visit   Subjective:  Patient ID: Clifford Newman, male    DOB: 1961/03/25  Age: 63 y.o. MRN: [id number]  No chief complaint on file.   HPI Encounter Diagnoses  Name Primary?   Elevated LDL cholesterol level Yes   Prediabetes    Hypokalemia    Essential hypertension    For follow-up of above.  Continues exercising on the elliptical and gentle weight lifting.  Has been able to lose 10 pounds.  I has retired but continues to work a part-time job at [employer].  Continues with the 10 mEq of potassium.   Review of Systems  Constitutional: Negative.   HENT: Negative.    Eyes:  Negative for blurred vision, discharge and redness.  Respiratory: Negative.    Cardiovascular: Negative.   Gastrointestinal:  Negative for abdominal pain.  Genitourinary: Negative.   Musculoskeletal: Negative.  Negative for myalgias.  Skin:  Negative for rash.  Neurological:  Negative for tingling, loss of consciousness and weakness.  Endo/Heme/Allergies:  Negative for polydipsia.     Current Outpatient Medications:    aspirin  EC (ASPIRIN  LOW DOSE) 81 MG tablet, Take 1 tablet (81 mg total) by mouth daily., Disp: 90 tablet, Rfl: 16   atorvastatin  (LIPITOR) 80 MG tablet, Take 1 tablet (80 mg total) by mouth daily., Disp: 90 tablet, Rfl: 3   chlorthalidone  (HYGROTON ) 25 MG tablet, Take 1 tablet (25 mg total) by mouth daily., Disp: 90 tablet, Rfl: 3   fluticasone  (FLONASE ) 50 MCG/ACT nasal spray, Place 2 sprays into both nostrils daily., Disp: 48 mL, Rfl: 2   omeprazole  (PRILOSEC) 20 MG capsule, Take 1 capsule by mouth once daily, Disp: 90 capsule, Rfl: 0   tadalafil  (CIALIS ) 20 MG tablet, TAKE 1/2 TO 1 (ONE-HALF TO ONE) TABLET BY MOUTH EVERY OTHER DAY AS NEEDED FOR ERECTILE DYSFUNCTION, Disp: 10 tablet, Rfl: 3   tamsulosin  (FLOMAX ) 0.4 MG CAPS capsule, Take 1 capsule by mouth once daily, Disp: 90 capsule, Rfl: 3   tirzepatide  (MOUNJARO ) 5 MG/0.5ML Pen, Inject 5 mg into the  skin once a week., Disp: 6 mL, Rfl: 1   potassium chloride  SA (KLOR-CON  M20) 20 MEQ tablet, Take 1 tablet (20 mEq total) by mouth daily., Disp: 90 tablet, Rfl: 0   Objective:     BP 124/80 (BP Location: Right Arm, Cuff Size: Normal)   Pulse 80   Temp (!) 97.1 F (36.2 C) (Temporal)   Ht 6' 1 (1.854 m)   Wt 230 lb 3.2 oz (104.4 kg)   SpO2 97%   BMI 30.37 kg/m  BP Readings from Last 3 Encounters:  11/25/23 124/80  08/26/23 124/72  06/24/23 132/84   Wt Readings from Last 3 Encounters:  11/25/23 230 lb 3.2 oz (104.4 kg)  08/26/23 241 lb 9.6 oz (109.6 kg)  06/24/23 235 lb 12.8 oz (107 kg)      Physical Exam Constitutional:      General: He is not in acute distress.    Appearance: Normal appearance. He is not ill-appearing, toxic-appearing or diaphoretic.  HENT:     Head: Normocephalic and atraumatic.     Right Ear: External ear normal.     Left Ear: External ear normal.  Eyes:     General: No scleral icterus.       Right eye: No discharge.        Left eye: No discharge.     Extraocular Movements: Extraocular movements intact.     Conjunctiva/sclera: Conjunctivae normal.  Pulmonary:     Effort: Pulmonary effort is normal. No respiratory distress.  Skin:    General: Skin is warm and dry.  Neurological:     Mental Status: He is alert and oriented to person, place, and time.  Psychiatric:        Mood and Affect: Mood normal.        Behavior: Behavior normal.      Results for orders placed or performed in visit on 11/25/23  Basic metabolic panel with GFR  Result Value Ref Range   Sodium 136 135 - 145 mEq/L   Potassium 3.1 (L) 3.5 - 5.1 mEq/L   Chloride 98 96 - 112 mEq/L   CO2 30 19 - 32 mEq/L   Glucose, Bld 132 (H) 70 - 99 mg/dL   BUN 18 6 - 23 mg/dL   Creatinine, Ser 9.09 0.40 - 1.50 mg/dL   GFR 10.15 >39.99 mL/min   Calcium  9.7 8.4 - 10.5 mg/dL  LDL cholesterol, direct  Result Value Ref Range   Direct LDL 83.0 mg/dL  Hemoglobin J8r  Result Value Ref  Range   Hgb A1c MFr Bld 6.1 4.6 - 6.5 %      The 10-year ASCVD risk score (Arnett DK, et al., 2019) is: 12.8%    Assessment & Plan:   Elevated LDL cholesterol level -     LDL cholesterol, direct -     Atorvastatin  Calcium ; Take 1 tablet (80 mg total) by mouth daily.  Dispense: 90 tablet; Refill: 3  Prediabetes -     Basic metabolic panel with GFR -     Hemoglobin A1c  Hypokalemia -     Basic metabolic panel with GFR -     Potassium Chloride  Crys ER; Take 1 tablet (20 mEq total) by mouth daily.  Dispense: 90 tablet; Refill: 0  Essential hypertension -     Basic metabolic panel with GFR    Return Recommended follow-up pends results of lab work today..  Goal for LDL is 70 or better.  Discussed increasing atorvastatin  to 80 mg.  Rechecking potassium.  Has not been taking 20 mEq tablets but continues with the 10 mEq.  Continues exercising on the elliptical and gentle weight lifting.  Has retired but continues to work a part-time job at [employer].  Elsie Sim Lent, MD  7/8 addendum: Increased atorvastatin  to 80 mg daily.

## 2023-11-26 ENCOUNTER — Ambulatory Visit: Payer: Self-pay | Admitting: Family Medicine

## 2023-11-26 LAB — HEMOGLOBIN A1C: Hgb A1c MFr Bld: 6.1 % (ref 4.6–6.5)

## 2023-11-26 LAB — BASIC METABOLIC PANEL WITH GFR
BUN: 18 mg/dL (ref 6–23)
CO2: 30 meq/L (ref 19–32)
Calcium: 9.7 mg/dL (ref 8.4–10.5)
Chloride: 98 meq/L (ref 96–112)
Creatinine, Ser: 0.9 mg/dL (ref 0.40–1.50)
GFR: 89.84 mL/min (ref >60.00)
Glucose, Bld: 132 mg/dL — ABNORMAL HIGH (ref 70–99)
Potassium: 3.1 meq/L — ABNORMAL LOW (ref 3.5–5.1)
Sodium: 136 meq/L (ref 135–145)

## 2023-11-26 LAB — LDL CHOLESTEROL, DIRECT: Direct LDL: 83 mg/dL

## 2023-11-26 MED ORDER — ATORVASTATIN CALCIUM 80 MG PO TABS: 80.0000 mg | ORAL_TABLET | Freq: Every day | ORAL | 3 refills | Status: AC

## 2023-11-26 NOTE — Addendum Note (Signed)
 Addended by: BERNETA ELSIE LABOR on: 11/26/2023 01:03 PM   Modules accepted: Orders

## 2023-11-29 ENCOUNTER — Other Ambulatory Visit: Payer: Self-pay | Admitting: Family Medicine

## 2023-11-29 DIAGNOSIS — E66812 Obesity, class 2: Secondary | ICD-10-CM

## 2023-11-29 DIAGNOSIS — R7303 Prediabetes: Secondary | ICD-10-CM

## 2023-12-26 ENCOUNTER — Ambulatory Visit (HOSPITAL_COMMUNITY)
Admission: RE | Admit: 2023-12-26 | Discharge: 2023-12-26 | Disposition: A | Discharge status: Home / Self Care | Source: Ambulatory Visit | Attending: Acute Care | Admitting: Acute Care

## 2023-12-26 DIAGNOSIS — Z87891 Personal history of nicotine dependence: Secondary | ICD-10-CM | POA: Insufficient documentation

## 2023-12-26 DIAGNOSIS — Z122 Encounter for screening for malignant neoplasm of respiratory organs: Secondary | ICD-10-CM | POA: Diagnosis present

## 2024-01-10 ENCOUNTER — Telehealth: Payer: Self-pay | Admitting: Acute Care

## 2024-01-10 ENCOUNTER — Encounter: Payer: Self-pay | Admitting: *Deleted

## 2024-01-10 ENCOUNTER — Telehealth: Payer: Self-pay

## 2024-01-10 ENCOUNTER — Other Ambulatory Visit: Payer: Self-pay | Admitting: Acute Care

## 2024-01-10 DIAGNOSIS — Q790 Congenital diaphragmatic hernia: Secondary | ICD-10-CM

## 2024-01-10 DIAGNOSIS — Z122 Encounter for screening for malignant neoplasm of respiratory organs: Secondary | ICD-10-CM

## 2024-01-10 DIAGNOSIS — Z87891 Personal history of nicotine dependence: Secondary | ICD-10-CM

## 2024-01-10 NOTE — Telephone Encounter (Signed)
 LVM to review results, will also have provider review additional findings.    IMPRESSION: 1. Lung-RADS 2, benign appearance or behavior. Continue annual screening with low-dose chest CT without contrast in 12 months. 2. Three-vessel coronary atherosclerosis. 3. Small to moderate fat containing right Bochdalek's hernia. 4. Aortic Atherosclerosis (ICD10-I70.0) and Emphysema (ICD10-J43.9).

## 2024-01-10 NOTE — Progress Notes (Signed)
 I have called the patient with the results of his low dose screening CT. His scan was read as a LR 2. benign appearance or behavior. Continue annual screening with low-dose chest CT without contrast in 12 months.  There was notation of a Small to moderate fat containing right Bochdalek's hernia. I explained that this is a rare congenital defect where the abdominal contents protrude into the chest through a weakness in the diaphragm . It is congenital, but may be exacerbated by obesity,trauma or strenuous physical exertion. He states he has been working out 7 days a week, and feels this may be a contributing factor. As this was not evident on his scan last year, I have referred to thoracic surgery for evaluation.Pt. is in agreement with his plan.  Annual lung cancer screening will be  due 12/2024.

## 2024-01-10 NOTE — Telephone Encounter (Signed)
 I have called the patient with the results of his low dose screening CT. His scan was read as a LR 2. benign appearance or behavior. Continue annual screening with low-dose chest CT without contrast in 12 months.  There was notation of a Small to moderate fat containing right Bochdalek's hernia. I explained that this is a rare congenital defect where the abdominal contents protrude into the chest through a weakness in the diaphragm . It is congenital, but may be exacerbated by obesity,trauma or strenuous physical exertion. He states he has been working out 7 days a week, and feels this may be a contributing factor. As this was not evident on his scan last year, I have referred to thoracic surgery for evaluation.Pt. is in agreement with his plan.  Annual lung cancer screening will be  due 12/2024.

## 2024-01-10 NOTE — Telephone Encounter (Signed)
 See previous telephone note.

## 2024-01-13 NOTE — Telephone Encounter (Signed)
 Results to PCP and new order placed for annual LDCT

## 2024-01-13 NOTE — Telephone Encounter (Signed)
 See provider note 01/10/2024

## 2024-01-15 NOTE — Progress Notes (Unsigned)
 [address]       [place] [address]             [phone]                    Clifford Newman [facility] Medical Record #[id number] Date of Birth: 03-May-1961  Referring: Clifford Lauraine FALCON, NP Primary Care: Clifford Elsie Sayre, MD Primary Cardiologist: None  Chief Complaint:   No chief complaint on file.   History of Present Illness:    Clifford Newman 63 y.o. male ***    Past Medical History:  Diagnosis Date   Allergy    seasonal   Arthritis    bilateral feet   Colon polyp 2001   Dr Clifford Newman   Hyperlipidemia     Past Surgical History:  Procedure Laterality Date   COLONOSCOPY  2006 & 2011   negative, Dr Clifford Newman   COLONOSCOPY W/ POLYPECTOMY  2001   CYSTECTOMY     Ganglion cyst-left wrist    HAND SURGERY  1983   Right thumb surgery    ROTATOR CUFF REPAIR Right    WISDOM TOOTH EXTRACTION      Family History  Problem Relation Age of Onset   Colon cancer Father 9   Diabetes Father        IDDM   Colon polyps Father    Stroke Paternal Grandfather        >55   Stroke Paternal Grandmother        > 66   Colon cancer Paternal Uncle    Colon cancer Paternal Uncle    Arthritis Mother    Heart disease Neg Hx    Esophageal cancer Neg Hx    Rectal cancer Neg Hx    Stomach cancer Neg Hx      Social History   Tobacco Use  Smoking Status Former   Current packs/day: 0.00   Average packs/day: 1.5 packs/day for 37.0 years (55.5 ttl pk-yrs)   Types: Cigarettes   Start date: 12/19/1977   Quit date: 12/20/2014   Years since quitting: 9.0  Smokeless Tobacco Former    Social History   Substance and Sexual Activity  Alcohol Use Yes   Comment: 4 shots per week     Allergies  Allergen Reactions   Amlodipine      lightheadedness    Current Outpatient Medications  Medication Sig Dispense Refill   aspirin  EC (ASPIRIN  LOW DOSE) 81 MG tablet Take 1 tablet (81 mg total) by mouth daily. 90 tablet 16   atorvastatin  (LIPITOR) 80 MG tablet Take 1  tablet (80 mg total) by mouth daily. 90 tablet 3   chlorthalidone  (HYGROTON ) 25 MG tablet Take 1 tablet (25 mg total) by mouth daily. 90 tablet 3   fluticasone  (FLONASE ) 50 MCG/ACT nasal spray Place 2 sprays into both nostrils daily. 48 mL 2   MOUNJARO  5 MG/0.5ML Pen INJECT 1/2 (ONE-HALF) ML SUBCUTANEOUSLY  ONCE A WEEK 12 mL 0   omeprazole  (PRILOSEC) 20 MG capsule Take 1 capsule by mouth once daily 90 capsule 0   potassium chloride  SA (KLOR-CON  M20) 20 MEQ tablet Take 1 tablet (20 mEq total) by mouth daily. 90 tablet 0   tadalafil  (CIALIS ) 20 MG tablet TAKE 1/2 TO 1 (ONE-HALF TO ONE) TABLET BY MOUTH EVERY OTHER DAY AS NEEDED FOR ERECTILE DYSFUNCTION 10 tablet 3   tamsulosin  (FLOMAX ) 0.4 MG CAPS capsule Take 1 capsule by mouth once daily 90 capsule 3  No current facility-administered medications for this visit.    ROS  PHYSICAL EXAMINATION: There were no vitals taken for this visit.  Physical Exam   Diagnostic Studies & Laboratory data:     Recent Radiology Findings:   CT CHEST LUNG CA SCREEN LOW DOSE W/O CM Result Date: 01/10/2024 CLINICAL DATA:  63 year old male former smoker with 35 pack-year smoking history, quit smoking 6 years prior EXAM: CT CHEST WITHOUT CONTRAST LOW-DOSE FOR LUNG CANCER SCREENING TECHNIQUE: Multidetector CT imaging of the chest was performed following the standard protocol without IV contrast. RADIATION DOSE REDUCTION: This exam was performed according to the departmental dose-optimization program which includes automated exposure control, adjustment of the mA and/or kV according to patient size and/or use of iterative reconstruction technique. COMPARISON:  12/24/2022 screening chest CT FINDINGS: Cardiovascular: Normal heart size. No significant pericardial effusion/thickening. Three-vessel coronary atherosclerosis. Atherosclerotic nonaneurysmal thoracic aorta. Normal caliber pulmonary arteries. Mediastinum/Nodes: No significant thyroid  nodules. Unremarkable esophagus.  No pathologically enlarged axillary, mediastinal or hilar lymph nodes, noting limited sensitivity for the detection of hilar adenopathy on this noncontrast study. Lungs/Pleura: No pneumothorax. No pleural effusion. Mild centrilobular emphysema with diffuse bronchial wall thickening. No acute consolidative airspace disease or lung masses. Stable tiny 2 mm right middle lobe solid pulmonary nodule on image 188. No new significant pulmonary nodules. Upper abdomen: Small to moderate fat containing right Bochdalek's hernia. Musculoskeletal: No aggressive appearing focal osseous lesions. Mild thoracic spondylosis. Partially visualized left total shoulder arthroplasty. IMPRESSION: 1. Lung-RADS 2, benign appearance or behavior. Continue annual screening with low-dose chest CT without contrast in 12 months. 2. Three-vessel coronary atherosclerosis. 3. Small to moderate fat containing right Bochdalek's hernia. 4. Aortic Atherosclerosis (ICD10-I70.0) and Emphysema (ICD10-J43.9). Electronically Signed   By: Selinda DELENA Blue M.D.   On: 01/10/2024 09:50       I have independently reviewed the above radiology studies  and reviewed the findings with the patient.   Recent Lab Findings: Lab Results  Component Value Date   WBC 3.5 (L) 05/22/2022   HGB 14.3 05/22/2022   HCT 43.3 05/22/2022   PLT 189.0 05/22/2022   GLUCOSE 132 (H) 11/25/2023   CHOL 187 02/20/2023   TRIG 61.0 02/20/2023   HDL 85.40 02/20/2023   LDLDIRECT 83.0 11/25/2023   LDLCALC 89 02/20/2023   ALT 20 05/22/2022   AST 19 05/22/2022   NA 136 11/25/2023   K 3.1 (L) 11/25/2023   CL 98 11/25/2023   CREATININE 0.90 11/25/2023   BUN 18 11/25/2023   CO2 30 11/25/2023   TSH 1.56 11/16/2021   HGBA1C 6.1 11/25/2023        Assessment / Plan:   62yo male with right Bochdalek hernia.  Posterior to the liver.  Would need to approach from the chest, but would be very challenging.       Clifford Newman 01/15/2024 4:27 PM

## 2024-01-17 ENCOUNTER — Ambulatory Visit
Attending: Thoracic Surgery (Cardiothoracic Vascular Surgery) | Admitting: Thoracic Surgery (Cardiothoracic Vascular Surgery)

## 2024-01-17 ENCOUNTER — Encounter: Payer: Self-pay | Admitting: Thoracic Surgery (Cardiothoracic Vascular Surgery)

## 2024-01-17 ENCOUNTER — Other Ambulatory Visit: Payer: Self-pay | Admitting: Family Medicine

## 2024-01-17 VITALS — BP 138/79 | HR 84 | Resp 20 | Ht 73.0 in | Wt 232.4 lb

## 2024-01-17 DIAGNOSIS — Q790 Congenital diaphragmatic hernia: Secondary | ICD-10-CM | POA: Insufficient documentation

## 2024-01-17 DIAGNOSIS — N401 Enlarged prostate with lower urinary tract symptoms: Secondary | ICD-10-CM

## 2024-01-31 ENCOUNTER — Other Ambulatory Visit: Payer: Self-pay | Admitting: Family Medicine

## 2024-01-31 DIAGNOSIS — I10 Essential (primary) hypertension: Secondary | ICD-10-CM

## 2024-02-11 ENCOUNTER — Ambulatory Visit: Admitting: Family Medicine

## 2024-02-19 ENCOUNTER — Other Ambulatory Visit: Payer: Self-pay | Admitting: Family Medicine

## 2024-02-19 DIAGNOSIS — Z6837 Body mass index (BMI) 37.0-37.9, adult: Secondary | ICD-10-CM

## 2024-02-19 DIAGNOSIS — R7303 Prediabetes: Secondary | ICD-10-CM

## 2024-03-01 ENCOUNTER — Other Ambulatory Visit: Payer: Self-pay | Admitting: Family Medicine

## 2024-03-01 DIAGNOSIS — I10 Essential (primary) hypertension: Secondary | ICD-10-CM

## 2024-04-13 ENCOUNTER — Other Ambulatory Visit: Payer: Self-pay | Admitting: Family Medicine

## 2024-04-13 ENCOUNTER — Ambulatory Visit (INDEPENDENT_AMBULATORY_CARE_PROVIDER_SITE_OTHER): Admitting: Family Medicine

## 2024-04-13 ENCOUNTER — Encounter: Payer: Self-pay | Admitting: Family Medicine

## 2024-04-13 VITALS — BP 104/60 | HR 89 | Temp 98.2°F | Ht 73.0 in | Wt 223.8 lb

## 2024-04-13 DIAGNOSIS — N401 Enlarged prostate with lower urinary tract symptoms: Secondary | ICD-10-CM

## 2024-04-13 DIAGNOSIS — E7800 Pure hypercholesterolemia, unspecified: Secondary | ICD-10-CM | POA: Diagnosis not present

## 2024-04-13 DIAGNOSIS — Z125 Encounter for screening for malignant neoplasm of prostate: Secondary | ICD-10-CM | POA: Diagnosis not present

## 2024-04-13 DIAGNOSIS — R7303 Prediabetes: Secondary | ICD-10-CM | POA: Diagnosis not present

## 2024-04-13 DIAGNOSIS — Z23 Encounter for immunization: Secondary | ICD-10-CM

## 2024-04-13 DIAGNOSIS — E876 Hypokalemia: Secondary | ICD-10-CM

## 2024-04-13 LAB — HEMOGLOBIN A1C: Hgb A1c MFr Bld: 5.8 % (ref 4.6–6.5)

## 2024-04-13 LAB — LIPID PANEL
Cholesterol: 160 mg/dL (ref 0–200)
HDL: 67.8 mg/dL (ref >39.00)
LDL Cholesterol: 82 mg/dL (ref 0–99)
NonHDL: 92.04
Total CHOL/HDL Ratio: 2
Triglycerides: 49 mg/dL (ref 0.0–149.0)
VLDL: 9.8 mg/dL (ref 0.0–40.0)

## 2024-04-13 LAB — COMPREHENSIVE METABOLIC PANEL WITH GFR
ALT: 28 U/L (ref 0–53)
AST: 27 U/L (ref 0–37)
Albumin: 4.2 g/dL (ref 3.5–5.2)
Alkaline Phosphatase: 46 U/L (ref 39–117)
BUN: 22 mg/dL (ref 6–23)
CO2: 32 meq/L (ref 19–32)
Calcium: 9.7 mg/dL (ref 8.4–10.5)
Chloride: 99 meq/L (ref 96–112)
Creatinine, Ser: 0.88 mg/dL (ref 0.40–1.50)
GFR: 90.21 mL/min (ref >60.00)
Glucose, Bld: 99 mg/dL (ref 70–99)
Potassium: 3.2 meq/L — ABNORMAL LOW (ref 3.5–5.1)
Sodium: 138 meq/L (ref 135–145)
Total Bilirubin: 0.8 mg/dL (ref 0.2–1.2)
Total Protein: 6.8 g/dL (ref 6.0–8.3)

## 2024-04-13 LAB — PSA: PSA: 3.51 ng/mL (ref 0.10–4.00)

## 2024-04-13 LAB — LDL CHOLESTEROL, DIRECT: Direct LDL: 82 mg/dL

## 2024-04-13 NOTE — Progress Notes (Signed)
 Established Patient Office Visit   Subjective:  Patient ID: Clifford Newman, male    DOB: 02/25/1961  Age: 63 y.o. MRN: [id number]  Chief Complaint  Patient presents with   Medical Management of Chronic Issues    3 month follow up. Pt is fasting. Pt wants to discuss being taken off of his PB meds and any other possible medication. Flu and Prevnar today.     HPI Encounter Diagnoses  Name Primary?   Prediabetes Yes   Elevated LDL cholesterol level    Immunization due    Screening for prostate cancer    Doing great.  Continues to lose weight with exercise and healthy dietary choices.  Mounjaro  has been helpful.  Blood pressures have been running in the less than 110/70 range.  Fully retired now.  May be   Review of Systems  Constitutional: Negative.   HENT: Negative.    Eyes:  Negative for blurred vision, discharge and redness.  Respiratory: Negative.    Cardiovascular: Negative.   Gastrointestinal:  Negative for abdominal pain.  Genitourinary: Negative.   Musculoskeletal: Negative.  Negative for myalgias.  Skin:  Negative for rash.  Neurological:  Negative for tingling, loss of consciousness and weakness.  Endo/Heme/Allergies:  Negative for polydipsia.      04/13/2024    9:18 AM 08/21/2022    2:45 PM 05/22/2022    9:59 AM  Depression screen PHQ 2/9  Decreased Interest 0 0 0  Down, Depressed, Hopeless 0 0 0  PHQ - 2 Score 0 0 0  Altered sleeping 0  0  Tired, decreased energy 0  0  Change in appetite 0  0  Feeling bad or failure about yourself  0  0  Trouble concentrating 0  0  Moving slowly or fidgety/restless 0  0  Suicidal thoughts 0  0  PHQ-9 Score 0  0   Difficult doing work/chores Not difficult at all  Not difficult at all     Data saved with a previous flowsheet row definition      Current Outpatient Medications:    aspirin  EC (ASPIRIN  LOW DOSE) 81 MG tablet, Take 1 tablet (81 mg total) by mouth daily., Disp: 90 tablet, Rfl: 16   atorvastatin  (LIPITOR) 80  MG tablet, Take 1 tablet (80 mg total) by mouth daily., Disp: 90 tablet, Rfl: 3   fluticasone  (FLONASE ) 50 MCG/ACT nasal spray, Place 2 sprays into both nostrils daily., Disp: 48 mL, Rfl: 2   MOUNJARO  5 MG/0.5ML Pen, INJECT 0.5 ML SUBCUTANEOUSLY  ONCE A WEEK, Disp: 12 mL, Rfl: 0   omeprazole  (PRILOSEC) 20 MG capsule, Take 1 capsule by mouth once daily, Disp: 90 capsule, Rfl: 0   tamsulosin  (FLOMAX ) 0.4 MG CAPS capsule, Take 1 capsule by mouth once daily, Disp: 90 capsule, Rfl: 0   tadalafil  (CIALIS ) 20 MG tablet, TAKE 1/2 TO 1 (ONE-HALF TO ONE) TABLET BY MOUTH EVERY OTHER DAY AS NEEDED FOR ERECTILE DYSFUNCTION (Patient not taking: Reported on 04/13/2024), Disp: 10 tablet, Rfl: 3   Objective:     BP 104/60 (BP Location: Left Arm, Patient Position: Sitting, Cuff Size: Large)   Pulse 89   Temp 98.2 F (36.8 C) (Oral)   Ht 6' 1 (1.854 m)   Wt 223 lb 12.8 oz (101.5 kg)   SpO2 95%   BMI 29.53 kg/m  BP Readings from Last 3 Encounters:  04/13/24 104/60  01/17/24 138/79  11/25/23 124/80   Wt Readings from Last 3 Encounters:  04/13/24 223  lb 12.8 oz (101.5 kg)  01/17/24 232 lb 6.4 oz (105.4 kg)  11/25/23 230 lb 3.2 oz (104.4 kg)      Physical Exam Constitutional:      General: He is not in acute distress.    Appearance: Normal appearance. He is not ill-appearing, toxic-appearing or diaphoretic.  HENT:     Head: Normocephalic and atraumatic.     Right Ear: External ear normal.     Left Ear: External ear normal.  Eyes:     General: No scleral icterus.       Right eye: No discharge.        Left eye: No discharge.     Extraocular Movements: Extraocular movements intact.     Conjunctiva/sclera: Conjunctivae normal.  Pulmonary:     Effort: Pulmonary effort is normal. No respiratory distress.  Skin:    General: Skin is warm and dry.  Neurological:     Mental Status: He is alert and oriented to person, place, and time.  Psychiatric:        Mood and Affect: Mood normal.         Behavior: Behavior normal.      No results found for any visits on 04/13/24.    The 10-year ASCVD risk score (Arnett DK, et al., 2019) is: 9.3%    Assessment & Plan:   Prediabetes -     LDL cholesterol, direct -     Hemoglobin A1c -     Comprehensive metabolic panel with GFR  Elevated LDL cholesterol level -     Lipid panel -     Comprehensive metabolic panel with GFR  Immunization due -     Pneumococcal conjugate vaccine 20-valent -     Flu vaccine HIGH DOSE PF(Fluzone Trivalent)  Screening for prostate cancer -     PSA    Return in about 6 months (around 10/11/2024).    Elsie Sim Lent, MD

## 2024-04-13 NOTE — Patient Instructions (Signed)
 Today.

## 2024-04-14 ENCOUNTER — Ambulatory Visit: Payer: Self-pay | Admitting: Family Medicine

## 2024-04-14 MED ORDER — POTASSIUM CHLORIDE CRYS ER 20 MEQ PO TBCR
20.0000 meq | EXTENDED_RELEASE_TABLET | Freq: Every day | ORAL | 0 refills | Status: AC
Start: 2024-04-14 — End: ?

## 2024-04-14 NOTE — Addendum Note (Signed)
 Addended by: BERNETA ELSIE LABOR on: 04/14/2024 07:51 AM   Modules accepted: Orders

## 2024-05-02 ENCOUNTER — Other Ambulatory Visit: Payer: Self-pay | Admitting: Family Medicine

## 2024-05-02 DIAGNOSIS — I10 Essential (primary) hypertension: Secondary | ICD-10-CM

## 2024-05-05 NOTE — Telephone Encounter (Signed)
 Medication discontinued by provider on 04/13/24

## 2024-05-08 ENCOUNTER — Ambulatory Visit

## 2024-05-10 ENCOUNTER — Encounter: Payer: Self-pay | Admitting: Family Medicine

## 2024-05-11 NOTE — Telephone Encounter (Signed)
 Called pharmacy and they will get the 80 mg Atorvastatin  ready for him.  Scheduled him for a lab appointment 05/12/24 @8 :40. Dm/cma

## 2024-05-12 ENCOUNTER — Other Ambulatory Visit

## 2024-05-12 DIAGNOSIS — E876 Hypokalemia: Secondary | ICD-10-CM

## 2024-05-12 LAB — POTASSIUM: Potassium: 3.7 meq/L (ref 3.5–5.1)

## 2024-05-13 ENCOUNTER — Other Ambulatory Visit: Payer: Self-pay | Admitting: Family Medicine

## 2024-05-13 DIAGNOSIS — R7303 Prediabetes: Secondary | ICD-10-CM

## 2024-05-13 DIAGNOSIS — Z6837 Body mass index (BMI) 37.0-37.9, adult: Secondary | ICD-10-CM

## 2024-10-13 ENCOUNTER — Ambulatory Visit: Admitting: Family Medicine
